# Patient Record
Sex: Female | Born: 1970
Health system: Southern US, Community
[De-identification: ages and names within clinical notes are randomized; demographics above are authoritative.]

## PROBLEM LIST (undated history)

## (undated) DIAGNOSIS — I350 Nonrheumatic aortic (valve) stenosis: Secondary | ICD-10-CM

## (undated) DIAGNOSIS — N840 Polyp of corpus uteri: Secondary | ICD-10-CM

## (undated) DIAGNOSIS — R011 Cardiac murmur, unspecified: Secondary | ICD-10-CM

## (undated) DIAGNOSIS — F419 Anxiety disorder, unspecified: Secondary | ICD-10-CM

## (undated) DIAGNOSIS — N898 Other specified noninflammatory disorders of vagina: Secondary | ICD-10-CM

## (undated) DIAGNOSIS — Q231 Congenital insufficiency of aortic valve: Secondary | ICD-10-CM

## (undated) HISTORY — DX: Cardiac murmur, unspecified: R01.1

## (undated) HISTORY — PX: CARDIAC VALVE REPLACEMENT: SHX585

## (undated) HISTORY — DX: Anxiety disorder, unspecified: F41.9

## (undated) HISTORY — PX: TRANSTHORACIC ECHOCARDIOGRAM: SHX275

---

## 1999-09-12 ENCOUNTER — Other Ambulatory Visit: Admission: RE | Admit: 1999-09-12 | Discharge: 1999-09-12 | Payer: Self-pay | Admitting: Gynecology

## 2000-10-03 ENCOUNTER — Other Ambulatory Visit: Admission: RE | Admit: 2000-10-03 | Discharge: 2000-10-03 | Payer: Self-pay | Admitting: Gynecology

## 2001-03-07 ENCOUNTER — Other Ambulatory Visit: Admission: RE | Admit: 2001-03-07 | Discharge: 2001-03-07 | Payer: Self-pay | Admitting: Gynecology

## 2001-09-30 ENCOUNTER — Other Ambulatory Visit: Admission: RE | Admit: 2001-09-30 | Discharge: 2001-09-30 | Payer: Self-pay | Admitting: Gynecology

## 2002-04-21 ENCOUNTER — Other Ambulatory Visit: Admission: RE | Admit: 2002-04-21 | Discharge: 2002-04-21 | Payer: Self-pay | Admitting: Gynecology

## 2002-11-19 ENCOUNTER — Other Ambulatory Visit: Admission: RE | Admit: 2002-11-19 | Discharge: 2002-11-19 | Payer: Self-pay | Admitting: Gynecology

## 2003-03-04 ENCOUNTER — Other Ambulatory Visit: Admission: RE | Admit: 2003-03-04 | Discharge: 2003-03-04 | Payer: Self-pay | Admitting: Gynecology

## 2003-11-26 ENCOUNTER — Other Ambulatory Visit: Admission: RE | Admit: 2003-11-26 | Discharge: 2003-11-26 | Payer: Self-pay | Admitting: Gynecology

## 2004-11-16 ENCOUNTER — Other Ambulatory Visit: Admission: RE | Admit: 2004-11-16 | Discharge: 2004-11-16 | Payer: Self-pay | Admitting: Gynecology

## 2006-01-30 ENCOUNTER — Other Ambulatory Visit: Admission: RE | Admit: 2006-01-30 | Discharge: 2006-01-30 | Payer: Self-pay | Admitting: Gynecology

## 2010-04-04 ENCOUNTER — Ambulatory Visit (INDEPENDENT_AMBULATORY_CARE_PROVIDER_SITE_OTHER): Payer: BC Managed Care – PPO | Admitting: Cardiology

## 2010-04-04 ENCOUNTER — Ambulatory Visit
Admission: RE | Admit: 2010-04-04 | Discharge: 2010-04-04 | Disposition: A | Discharge status: Home / Self Care | Payer: BC Managed Care – PPO | Source: Ambulatory Visit | Attending: Cardiology | Admitting: Cardiology

## 2010-04-04 ENCOUNTER — Other Ambulatory Visit: Payer: Self-pay | Admitting: Cardiology

## 2010-04-04 DIAGNOSIS — R0602 Shortness of breath: Secondary | ICD-10-CM

## 2010-04-04 DIAGNOSIS — R079 Chest pain, unspecified: Secondary | ICD-10-CM

## 2010-04-04 DIAGNOSIS — I359 Nonrheumatic aortic valve disorder, unspecified: Secondary | ICD-10-CM

## 2010-04-08 ENCOUNTER — Ambulatory Visit (HOSPITAL_COMMUNITY): Payer: BC Managed Care – PPO | Attending: Cardiology

## 2010-04-08 ENCOUNTER — Encounter: Payer: Self-pay | Admitting: Cardiology

## 2010-04-08 DIAGNOSIS — I359 Nonrheumatic aortic valve disorder, unspecified: Secondary | ICD-10-CM

## 2010-04-08 DIAGNOSIS — R072 Precordial pain: Secondary | ICD-10-CM

## 2010-04-08 DIAGNOSIS — R0989 Other specified symptoms and signs involving the circulatory and respiratory systems: Secondary | ICD-10-CM

## 2010-04-08 DIAGNOSIS — R0609 Other forms of dyspnea: Secondary | ICD-10-CM

## 2011-05-24 ENCOUNTER — Encounter: Payer: Self-pay | Admitting: *Deleted

## 2011-10-11 ENCOUNTER — Encounter (HOSPITAL_BASED_OUTPATIENT_CLINIC_OR_DEPARTMENT_OTHER): Payer: Self-pay | Admitting: *Deleted

## 2011-10-12 ENCOUNTER — Encounter (HOSPITAL_BASED_OUTPATIENT_CLINIC_OR_DEPARTMENT_OTHER): Payer: Self-pay | Admitting: *Deleted

## 2011-10-13 ENCOUNTER — Encounter (HOSPITAL_BASED_OUTPATIENT_CLINIC_OR_DEPARTMENT_OTHER): Payer: Self-pay | Admitting: *Deleted

## 2011-10-24 ENCOUNTER — Encounter: Payer: Self-pay | Admitting: Cardiology

## 2011-10-24 ENCOUNTER — Ambulatory Visit (INDEPENDENT_AMBULATORY_CARE_PROVIDER_SITE_OTHER): Payer: 59 | Admitting: Cardiology

## 2011-10-24 VITALS — BP 99/75 | HR 65 | Ht 66.0 in | Wt 162.8 lb

## 2011-10-24 DIAGNOSIS — I359 Nonrheumatic aortic valve disorder, unspecified: Secondary | ICD-10-CM

## 2011-10-24 DIAGNOSIS — I350 Nonrheumatic aortic (valve) stenosis: Secondary | ICD-10-CM | POA: Insufficient documentation

## 2011-10-24 NOTE — Progress Notes (Signed)
Shelby Eaton Date of Birth:  [birth year] [facility] [address] [place], [place]  [address] [phone]  Fax   [phone]  HPI: This pleasant 41 year old woman is seen for a followup office visit.  She was last seen in March 2012.  She has a history of known congenital aortic valve disease.  She has had a cardiac murmur since she was an infant.  We first saw her in 2003.  She had an echocardiogram 12/18/07 showing an aortic valve area of 0.7 she also has moderate to severe aortic insufficiency.  She has not been experiencing any cardiac symptoms.  She had a chest x-ray in March 2012 which showed normal heart size and no CHF.  She denies any chest pain or shortness of breath.  She exercises regularly.  She's had no palpitations.  She's had no exertional dizziness or syncope.  She plans to walk in the women's 5K race in the near future.  She is scheduled for a hysteroscopy and removal of some uterine polyps by Dr. Nicholas Lose on 11/07/2011  Current Outpatient Prescriptions  Medication Sig Dispense Refill  . aspirin 81 MG tablet Take 81 mg by mouth daily.        No Known Allergies  Patient Active Problem List  Diagnosis  . Aortic stenosis    History  Smoking status  . Former Smoker -- 20 years  . Types: Cigarettes  . Quit date: 08/16/2011  Smokeless tobacco  . Never Used  Comment: 1 PACK LAST FOR 2 WEEKS-- QUIT 2 MONTHS AGO    History  Alcohol Use  . Yes    weekends    No family history on file.  Review of Systems: The patient denies any heat or cold intolerance.  No weight gain or weight loss.  The patient denies headaches or blurry vision.  There is no cough or sputum production.  The patient denies dizziness.  There is no hematuria or hematochezia.  The patient denies any muscle aches or arthritis.  The patient denies any rash.  The patient denies frequent falling or instability.  There is no history of depression or anxiety.  All other systems were  reviewed and are negative.   Physical Exam: Filed Vitals:   10/24/11 0935  BP: 99/75  Pulse: 65   the general appearance reveals a healthy-appearing woman in no distress.Pupils equal and reactive.   Extraocular Movements are full.  There is no scleral icterus.  The mouth and pharynx are normal.  The neck is supple.  The carotids reveal normal upstroke and she does have bilateral bruits transmitted from the heart. The jugular venous pressure is normal.  The thyroid is not enlarged.  There is no lymphadenopathy. The chest is clear to percussion and auscultation. There are no rales or rhonchi. Expansion of the chest is symmetrical.  The heart reveals a grade 3/6 harsh systolic murmur of aortic stenosis the base which radiates into the neck and she has a grade 2/6 decrescendo murmur of aortic insufficiency of the left sternal edge.  There is no gallop or rub.  The abdomen is soft and nontender.  Extremities show no phlebitis or edema.  EKG shows sinus bradycardia and no ischemic changes.   Assessment / Plan: The patient is cleared for her upcoming gynecologic surgery with Dr. Nicholas Lose. We will plan to see her here in 6 months for a followup office visit and we will plan to get a two-dimensional echocardiogram prior to her next office visit.  The patient is not on any cardiac medications.  She takes an occasional baby aspirin.  She will leave off her aspirin for one week prior to GYN surgery.

## 2011-10-24 NOTE — Patient Instructions (Signed)
Your physician recommends that you continue on your current medications as directed. Please refer to the Current Medication list given to you today.  Your physician wants you to follow-up in: 6 months. You will receive a reminder letter in the mail two months in advance. If you don't receive a letter, please call our office to schedule the follow-up appointment.  

## 2011-10-24 NOTE — Assessment & Plan Note (Signed)
The patient remains asymptomatic from her aortic stenosis and aortic insufficiency.  Her electrocardiogram today shows sinus bradycardia at 59 per minute and possible left atrial enlargement and no ischemic changes.  She does have nonspecific T-wave flattening.  There is no evidence of LVH or strain

## 2011-11-02 ENCOUNTER — Encounter (HOSPITAL_BASED_OUTPATIENT_CLINIC_OR_DEPARTMENT_OTHER): Payer: Self-pay | Admitting: *Deleted

## 2011-11-03 ENCOUNTER — Encounter (HOSPITAL_BASED_OUTPATIENT_CLINIC_OR_DEPARTMENT_OTHER): Payer: Self-pay | Admitting: *Deleted

## 2011-11-03 NOTE — Progress Notes (Signed)
NPO AFTER MN. ARRIVES AT 0615. NEEDS HG AND URINE PREG. REVIEWED CHART W/ DR FORTUNE MDA W/ CARDIAC CLEARANCE, OK TO PROCEED.

## 2011-11-06 MED ORDER — SODIUM CHLORIDE 0.9 % IV SOLN
2.0000 g | Freq: Once | INTRAVENOUS | Status: AC
  Administered 2011-11-07: 2 g via INTRAVENOUS
  Filled 2011-11-06: qty 2000

## 2011-11-06 MED ORDER — GENTAMICIN SULFATE 40 MG/ML IJ SOLN
360.0000 mg | Freq: Once | INTRAVENOUS | Status: DC
  Filled 2011-11-06: qty 9

## 2011-11-06 MED ORDER — GENTAMICIN SULFATE 40 MG/ML IJ SOLN: 5.0000 mg/kg | Freq: Once | INTRAVENOUS | Status: DC

## 2011-11-06 NOTE — Anesthesia Preprocedure Evaluation (Addendum)
Anesthesia Evaluation  Patient identified by MRN, date of birth, ID band Patient awake    Reviewed: Allergy & Precautions, H&P , NPO status , Patient's Chart, lab work & pertinent test results  Airway Mallampati: II TM Distance: >3 FB Neck ROM: Full    Dental  (+) Teeth Intact and Dental Advisory Given   Pulmonary neg pulmonary ROS,  breath sounds clear to auscultation  Pulmonary exam normal       Cardiovascular negative cardio ROS  Valvular problems/murmurs: Severe aortic valve stenosis/aortic insufficiency per history. AS and AI Rhythm:Regular Rate:Normal + Systolic murmurs (Gd. 3-4/6) 2D ECHO 2009: severe AS with moderately severe AI; no pulmonary hypertension; asymptomatic   Neuro/Psych negative neurological ROS  negative psych ROS   GI/Hepatic negative GI ROS, Neg liver ROS,   Endo/Other  negative endocrine ROS  Renal/GU negative Renal ROS  negative genitourinary   Musculoskeletal negative musculoskeletal ROS (+)   Abdominal   Peds  Hematology negative hematology ROS (+)   Anesthesia Other Findings   Reproductive/Obstetrics negative OB ROS                         Anesthesia Physical Anesthesia Plan  ASA: III  Anesthesia Plan: MAC   Post-op Pain Management:    Induction: Intravenous  Airway Management Planned: Simple Face Mask  Additional Equipment:   Intra-op Plan:   Post-operative Plan:   Informed Consent: I have reviewed the patients History and Physical, chart, labs and discussed the procedure including the risks, benefits and alternatives for the proposed anesthesia with the patient or authorized representative who has indicated his/her understanding and acceptance.   Dental advisory given  Plan Discussed with: CRNA  Anesthesia Plan Comments:        Anesthesia Quick Evaluation

## 2011-11-07 ENCOUNTER — Ambulatory Visit (HOSPITAL_BASED_OUTPATIENT_CLINIC_OR_DEPARTMENT_OTHER)
Admission: RE | Admit: 2011-11-07 | Discharge: 2011-11-07 | Disposition: A | Discharge status: Home / Self Care | Payer: 59 | Source: Ambulatory Visit | Attending: Gynecology | Admitting: Gynecology

## 2011-11-07 ENCOUNTER — Encounter (HOSPITAL_BASED_OUTPATIENT_CLINIC_OR_DEPARTMENT_OTHER)
Admission: RE | Disposition: A | Discharge status: Home / Self Care | Payer: Self-pay | Source: Ambulatory Visit | Attending: Gynecology

## 2011-11-07 ENCOUNTER — Encounter (HOSPITAL_BASED_OUTPATIENT_CLINIC_OR_DEPARTMENT_OTHER): Payer: Self-pay | Admitting: Anesthesiology

## 2011-11-07 ENCOUNTER — Ambulatory Visit (HOSPITAL_BASED_OUTPATIENT_CLINIC_OR_DEPARTMENT_OTHER): Payer: 59 | Admitting: Anesthesiology

## 2011-11-07 ENCOUNTER — Encounter (HOSPITAL_BASED_OUTPATIENT_CLINIC_OR_DEPARTMENT_OTHER): Payer: Self-pay | Admitting: *Deleted

## 2011-11-07 DIAGNOSIS — N840 Polyp of corpus uteri: Secondary | ICD-10-CM | POA: Insufficient documentation

## 2011-11-07 DIAGNOSIS — N926 Irregular menstruation, unspecified: Secondary | ICD-10-CM | POA: Insufficient documentation

## 2011-11-07 DIAGNOSIS — D281 Benign neoplasm of vagina: Secondary | ICD-10-CM | POA: Insufficient documentation

## 2011-11-07 DIAGNOSIS — N939 Abnormal uterine and vaginal bleeding, unspecified: Secondary | ICD-10-CM | POA: Insufficient documentation

## 2011-11-07 DIAGNOSIS — R011 Cardiac murmur, unspecified: Secondary | ICD-10-CM | POA: Insufficient documentation

## 2011-11-07 HISTORY — DX: Nonrheumatic aortic (valve) stenosis: I35.0

## 2011-11-07 HISTORY — DX: Polyp of corpus uteri: N84.0

## 2011-11-07 HISTORY — DX: Congenital insufficiency of aortic valve: Q23.1

## 2011-11-07 HISTORY — DX: Other specified noninflammatory disorders of vagina: N89.8

## 2011-11-07 LAB — POCT PREGNANCY, URINE: Preg Test, Ur: NEGATIVE

## 2011-11-07 LAB — POCT HEMOGLOBIN-HEMACUE: Hemoglobin: 11.9 g/dL — ABNORMAL LOW (ref 12.0–15.0)

## 2011-11-07 SURGERY — DILATATION & CURETTAGE/HYSTEROSCOPY WITH RESECTOCOPE
Anesthesia: Monitor Anesthesia Care | Site: Vagina | Laterality: Right | Wound class: Clean Contaminated

## 2011-11-07 MED ORDER — LACTATED RINGERS IV SOLN: INTRAVENOUS | Status: DC

## 2011-11-07 MED ORDER — PROMETHAZINE HCL 25 MG/ML IJ SOLN: 6.2500 mg | INTRAMUSCULAR | Status: DC | PRN

## 2011-11-07 MED ORDER — FENTANYL CITRATE 0.05 MG/ML IJ SOLN
INTRAMUSCULAR | Status: DC | PRN
  Administered 2011-11-07 (×3): 12.5 ug via INTRAVENOUS
  Administered 2011-11-07: 50 ug via INTRAVENOUS
  Administered 2011-11-07: 12.5 ug via INTRAVENOUS

## 2011-11-07 MED ORDER — PROPOFOL 10 MG/ML IV BOLUS
INTRAVENOUS | Status: DC | PRN
  Administered 2011-11-07: 30 mg via INTRAVENOUS

## 2011-11-07 MED ORDER — LIDOCAINE HCL (CARDIAC) 20 MG/ML IV SOLN
INTRAVENOUS | Status: DC | PRN
  Administered 2011-11-07: 50 mg via INTRAVENOUS

## 2011-11-07 MED ORDER — PROPOFOL 10 MG/ML IV EMUL
INTRAVENOUS | Status: DC | PRN
  Administered 2011-11-07: 100 ug/kg/min via INTRAVENOUS

## 2011-11-07 MED ORDER — GLYCINE 1.5 % IR SOLN
Status: DC | PRN
  Administered 2011-11-07: 6000 mL

## 2011-11-07 MED ORDER — LIDOCAINE HCL (PF) 1 % IJ SOLN
INTRAMUSCULAR | Status: DC | PRN
  Administered 2011-11-07: 10 mL

## 2011-11-07 MED ORDER — ONDANSETRON HCL 4 MG/2ML IJ SOLN
INTRAMUSCULAR | Status: DC | PRN
  Administered 2011-11-07 (×2): 2 mg via INTRAVENOUS

## 2011-11-07 MED ORDER — LACTATED RINGERS IV SOLN
INTRAVENOUS | Status: DC
  Administered 2011-11-07: 07:00:00 via INTRAVENOUS

## 2011-11-07 MED ORDER — LACTATED RINGERS IV SOLN
INTRAVENOUS | Status: DC
  Administered 2011-11-07: 10:00:00 via INTRAVENOUS

## 2011-11-07 MED ORDER — OXYCODONE-ACETAMINOPHEN 5-325 MG PO TABS
1.0000 | ORAL_TABLET | ORAL | Status: DC | PRN
  Administered 2011-11-07: 1 via ORAL

## 2011-11-07 MED ORDER — MORPHINE SULFATE 2 MG/ML IJ SOLN: 1.0000 mg | INTRAMUSCULAR | Status: DC | PRN

## 2011-11-07 MED ORDER — DEXAMETHASONE SODIUM PHOSPHATE 4 MG/ML IJ SOLN
INTRAMUSCULAR | Status: DC | PRN
  Administered 2011-11-07 (×2): 4 mg via INTRAVENOUS

## 2011-11-07 MED ORDER — GENTAMICIN SULFATE 40 MG/ML IJ SOLN
120.0000 mg | Freq: Once | INTRAVENOUS | Status: AC
  Administered 2011-11-07: 120 mg via INTRAVENOUS

## 2011-11-07 MED ORDER — MIDAZOLAM HCL 5 MG/5ML IJ SOLN
INTRAMUSCULAR | Status: DC | PRN
  Administered 2011-11-07: 2 mg via INTRAVENOUS

## 2011-11-07 SURGICAL SUPPLY — 58 items
BLADE SURG 15 STRL LF DISP TIS (BLADE) ×2 IMPLANT
BLADE SURG 15 STRL SS (BLADE) ×1
CANISTER SUCTION 1200CC (MISCELLANEOUS) IMPLANT
CANISTER SUCTION 2500CC (MISCELLANEOUS) ×6 IMPLANT
CATH ROBINSON RED A/P 16FR (CATHETERS) ×3 IMPLANT
CLOTH BEACON ORANGE TIMEOUT ST (SAFETY) ×3 IMPLANT
CORD ACTIVE DISPOSABLE (ELECTRODE) ×1
CORD ELECTRO ACTIVE DISP (ELECTRODE) ×2 IMPLANT
COVER TABLE BACK 60X90 (DRAPES) ×3 IMPLANT
DRAPE CAMERA CLOSED 9X96 (DRAPES) ×3 IMPLANT
DRAPE LG THREE QUARTER DISP (DRAPES) ×3 IMPLANT
DRAPE UNDERBUTTOCKS STRL (DRAPE) ×3 IMPLANT
ELECT LOOP GYNE PRO 24FR (CUTTING LOOP) ×3
ELECT NEEDLE TIP 2.8 STRL (NEEDLE) IMPLANT
ELECT REM PT RETURN 9FT ADLT (ELECTROSURGICAL) ×3
ELECT VAPORTRODE GRVD BAR (ELECTRODE) IMPLANT
ELECTRODE LOOP GYNE PRO 24FR (CUTTING LOOP) ×2 IMPLANT
ELECTRODE REM PT RTRN 9FT ADLT (ELECTROSURGICAL) ×2 IMPLANT
ETHILON ×3 IMPLANT
GLOVE BIO SURGEON STRL SZ8 (GLOVE) ×6 IMPLANT
GLOVE BIOGEL PI IND STRL 7.0 (GLOVE) ×2 IMPLANT
GLOVE BIOGEL PI INDICATOR 7.0 (GLOVE) ×1
GLOVE ECLIPSE 7.0 STRL STRAW (GLOVE) ×3 IMPLANT
GLYCINE 1.5% IRRIG UROMATIC (IV SOLUTION) ×6 IMPLANT
GOWN PREVENTION PLUS LG XLONG (DISPOSABLE) ×6 IMPLANT
GOWN STRL NON-REIN LRG LVL3 (GOWN DISPOSABLE) ×3 IMPLANT
GOWN STRL REIN XL XLG (GOWN DISPOSABLE) ×3 IMPLANT
LEGGING LITHOTOMY PAIR STRL (DRAPES) ×3 IMPLANT
NEEDLE HYPO 25X1 1.5 SAFETY (NEEDLE) IMPLANT
NEEDLE HYPO 25X5/8 SAFETYGLIDE (NEEDLE) IMPLANT
NEEDLE SPNL 22GX3.5 QUINCKE BK (NEEDLE) ×3 IMPLANT
NS IRRIG 500ML POUR BTL (IV SOLUTION) IMPLANT
PACK BASIN DAY SURGERY FS (CUSTOM PROCEDURE TRAY) ×3 IMPLANT
PAD OB MATERNITY 4.3X12.25 (PERSONAL CARE ITEMS) ×3 IMPLANT
PAD PREP 24X48 CUFFED NSTRL (MISCELLANEOUS) ×3 IMPLANT
PENCIL BUTTON HOLSTER BLD 10FT (ELECTRODE) ×3 IMPLANT
STRIP CLOSURE SKIN 1/4X4 (GAUZE/BANDAGES/DRESSINGS) IMPLANT
SUT MON AB 5-0 P3 18 (SUTURE) IMPLANT
SUT VIC AB 2-0 SH 27 (SUTURE) ×1
SUT VIC AB 2-0 SH 27XBRD (SUTURE) ×2 IMPLANT
SUT VIC AB 3-0 PS2 18 (SUTURE)
SUT VIC AB 3-0 PS2 18XBRD (SUTURE) IMPLANT
SUT VIC AB 3-0 SH 27 (SUTURE)
SUT VIC AB 3-0 SH 27X BRD (SUTURE) IMPLANT
SUT VIC AB 4-0 SH 27 (SUTURE)
SUT VIC AB 4-0 SH 27XANBCTRL (SUTURE) IMPLANT
SUT VIC AB 5-0 PS2 18 (SUTURE) ×3 IMPLANT
SUT VICRYL 6 0 UNDY PS 6 (SUTURE) IMPLANT
SWAB CULTURE LIQ STUART DBL (MISCELLANEOUS) IMPLANT
SYR BULB IRRIGATION 50ML (SYRINGE) IMPLANT
SYR CONTROL 10ML LL (SYRINGE) ×3 IMPLANT
TOWEL OR 17X24 6PK STRL BLUE (TOWEL DISPOSABLE) ×6 IMPLANT
TRAY DSU PREP LF (CUSTOM PROCEDURE TRAY) ×3 IMPLANT
TUBE ANAEROBIC SPECIMEN COL (MISCELLANEOUS) IMPLANT
TUBE CONNECTING 12X1/4 (SUCTIONS) IMPLANT
TUBING HYDROFLEX HYSTEROSCOPY (TUBING) ×3 IMPLANT
WATER STERILE IRR 500ML POUR (IV SOLUTION) ×3 IMPLANT
YANKAUER SUCT BULB TIP NO VENT (SUCTIONS) IMPLANT

## 2011-11-07 NOTE — Transfer of Care (Signed)
Immediate Anesthesia Transfer of Care Note  Patient: Shelby Eaton  Procedure(s) Performed: Procedure(s) (LRB): DILATATION & CURETTAGE/HYSTEROSCOPY WITH RESECTOCOPE (N/A) EXCISION VAGINAL CYST (Right)  Patient Location: PACU  Anesthesia Type: MAC  Level of Consciousness: awake, alert  and oriented  Airway & Oxygen Therapy: Patient Spontanous Breathing and Patient connected to face mask oxygen  Post-op Assessment: Report given to PACU RN and Post -op Vital signs reviewed and stable  Post vital signs: Reviewed and stable  Complications: No apparent anesthesia complications

## 2011-11-07 NOTE — Anesthesia Postprocedure Evaluation (Signed)
Anesthesia Post Note  Patient: Shelby Eaton  Procedure(s) Performed: Procedure(s) (LRB): DILATATION & CURETTAGE/HYSTEROSCOPY WITH RESECTOCOPE (N/A) EXCISION VAGINAL CYST (Right)  Anesthesia type: MAC  Patient location: PACU  Post pain: Pain level controlled  Post assessment: Post-op Vital signs reviewed  Last Vitals:  Filed Vitals:   11/07/11 0930  BP: 108/64  Pulse: 45  Temp:   Resp: 18    Post vital signs: Reviewed  Level of consciousness: sedated  Complications: No apparent anesthesia complications

## 2011-11-07 NOTE — Anesthesia Procedure Notes (Signed)
Procedure Name: MAC Date/Time: 11/07/2011 7:40 AM Performed by: Norva Pavlov Pre-anesthesia Checklist: Patient identified, Emergency Drugs available, Suction available, Patient being monitored and Timeout performed Patient Re-evaluated:Patient Re-evaluated prior to inductionOxygen Delivery Method: Simple face mask Intubation Type: IV induction Ventilation: Oral airway inserted - appropriate to patient size Dental Injury: Teeth and Oropharynx as per pre-operative assessment

## 2011-11-08 NOTE — Op Note (Signed)
Shelby Eaton, SCHUELKE NO.:  [id number]  MEDICAL RECORD NO.:  [id number]  LOCATION:                               FACILITY:  [facility]  PHYSICIAN:  Gretta Cool, M.D.      DATE OF BIRTH:  DATE OF PROCEDURE:  11/07/2011 DATE OF DISCHARGE:  11/07/2011                              OPERATIVE REPORT   PREOPERATIVE DIAGNOSES: 1. Endometrial polyps demonstrated by saline ultrasound with abnormal     uterine bleeding on hormonal contraception. 2. Prolapsing wolffian remnant cyst from the right lateral vaginal     wall prolapsing through the introitus with coughing or straining.  PROCEDURE:  Hysteroscopy with endometrial sampling and excision of wolffian duct remnant from the right lateral vaginal wall, and revision of right lateral vaginal mucosa.  SURGEON:  Gretta Cool, MD  ANESTHESIA:  IV sedation and paracervical block and local infiltration for the vaginal wall cyst.  DESCRIPTION OF PROCEDURE:  Under excellent anesthesia as above with the patient prepped and draped in Allen stirrups, and her bladder drained. A weighted speculum was placed in the vagina and the cervix grasped with a single-tooth tenaculum pulled down into view.  The cervix was then progressively dilated with a series of Pratt dilators to accommodate the 7-mm resectoscope.  The resectoscope was then introduced and the cavity examined.  Interestingly, the polyps observed previously by saline ultrasound and documented well, had resolved completely, even though they were centimeter in size and two of them.  At this point, the endometrial cavity was sampled with resectoscope loop, but no resection was performed.  At this point, attention was turned to the right lateral vaginal wall cyst.  The cyst was easily prolapsed through the vaginal introitus for a distance of 2-3 cm.  It was grasped with Allis clamps and then infiltrated with Xylocaine 1% plain.  The lesion was then incised vertically and  the cyst wall dissected from the vaginal mucosa. This was well deflated as the dissection went along.  The entire cyst wall was dissected free from the vaginal mucosa as far up as I could reach.  It became a more fibrous and very firm structure at its base. At the base of the procedure when I could no longer pursue it, the residue was clamped across with hemostat clamp and transected.  It was sutured with 2-0 Vicryl.  At this point, all bleeding was well controlled.  The mucosa was trimmed and the right lateral vaginal wall was closed with a running subcuticular closure of 2-0 Vicryl.  At the end of the procedure, there was no significant bleeding.  The patient was returned to the recovery room in excellent condition without complication.         ______________________________ Gretta Cool, M.D.    CWL/MEDQ  D:  11/07/2011  T:  11/07/2011  Job:  [id number]

## 2012-01-08 ENCOUNTER — Ambulatory Visit (INDEPENDENT_AMBULATORY_CARE_PROVIDER_SITE_OTHER): Payer: 59 | Admitting: Physician Assistant

## 2012-01-08 VITALS — BP 142/90 | HR 56 | Ht 66.0 in | Wt 165.8 lb

## 2012-01-08 DIAGNOSIS — I359 Nonrheumatic aortic valve disorder, unspecified: Secondary | ICD-10-CM

## 2012-01-08 DIAGNOSIS — R079 Chest pain, unspecified: Secondary | ICD-10-CM

## 2012-01-08 DIAGNOSIS — R109 Unspecified abdominal pain: Secondary | ICD-10-CM

## 2012-01-08 LAB — BASIC METABOLIC PANEL
Chloride: 103 mEq/L (ref 96–112)
Creatinine, Ser: 0.8 mg/dL (ref 0.4–1.2)
GFR: 79.63 mL/min (ref >60.00)

## 2012-01-08 LAB — CBC WITH DIFFERENTIAL/PLATELET
Basophils Relative: 0.7 % (ref 0.0–3.0)
Eosinophils Relative: 0.9 % (ref 0.0–5.0)
Lymphocytes Relative: 33.2 % (ref 12.0–46.0)
MCV: 84.4 fl (ref 78.0–100.0)
Monocytes Absolute: 0.5 10*3/uL (ref 0.1–1.0)
Monocytes Relative: 5.1 % (ref 3.0–12.0)
Neutrophils Relative %: 60.1 % (ref 43.0–77.0)
RBC: 4.48 Mil/uL (ref 3.87–5.11)
WBC: 9.6 10*3/uL (ref 4.5–10.5)

## 2012-01-08 LAB — HEPATIC FUNCTION PANEL
ALT: 16 U/L (ref 0–35)
AST: 18 U/L (ref 0–37)
Alkaline Phosphatase: 49 U/L (ref 39–117)
Bilirubin, Direct: 0.1 mg/dL (ref 0.0–0.3)
Total Protein: 7.5 g/dL (ref 6.0–8.3)

## 2012-01-08 MED ORDER — OMEPRAZOLE 40 MG PO CPDR: 40.0000 mg | DELAYED_RELEASE_CAPSULE | Freq: Every day | ORAL | Status: DC

## 2012-01-08 NOTE — Progress Notes (Signed)
[address], [address] [place], [place]  [address] Phone: [phone], Fax:  [phone]  Date:  01/08/2012   Name:  Shelby Eaton   DOB:  1970-02-09   MRN:  [id number]  PCP:  No primary provider on file.  Primary Cardiologist:  Dr. Cassell Clement  Primary Electrophysiologist:  None    History of Present Illness: Shelby Eaton is a 41 y.o. female who returns for evaluation of chest pain.  She has a hx of congenital aortic valve disease with aortic stenosis and aortic insufficiency. Echo 11/09:  EF 55-60%, severe AS, mean gradient 51 mmHg, AVA 0.7, mod to severe AI (no change from 5/08).  Last seen by Dr. Patty Sermons 10/13.  Over the last 1-2 mos, she has developed chest tightness. This is brought on by meals.  No assoc dyspnea, arm or jaw pain, diaphoresis or nausea.  She has had no relief with antacids.  She is concerned it is from her gallbladder.  No syncope.  No exertional chest pain.  No exertional dyspnea.  No orthopnea, PND, edema.   Wt Readings from Last 3 Encounters:  01/08/12 165 lb 12.8 oz (75.206 kg)  11/07/11 163 lb 5 oz (74.078 kg)  11/07/11 163 lb 5 oz (74.078 kg)     Past Medical History  Diagnosis Date  . Murmur   . Severe aortic valve stenosis CONGENITAL --  CARDIOLOGIST -- DR BRACKBILL-- LOV IN EPIC   . Aortic valve insufficiency, congenital   . Endometrial polyp   . Vaginal cyst     Current Outpatient Prescriptions  Medication Sig Dispense Refill  . aspirin 81 MG tablet Take 81 mg by mouth daily.        Allergies:  No Known Allergies  Social History:  The patient  reports that she quit smoking about 4 months ago. Her smoking use included Cigarettes. She quit after 20 years of use. She has never used smokeless tobacco. She reports that she drinks alcohol. She reports that she does not use illicit drugs.   ROS:  Please see the history of present illness.   All other systems reviewed and negative.   PHYSICAL EXAM: VS:  BP 142/90   Pulse 56  Ht 5\' 6"  (1.676 m)  Wt 165 lb 12.8 oz (75.206 kg)  BMI 26.76 kg/m2 Well nourished, well developed, in no acute distress HEENT: normal Neck: no JVD Cardiac:  normal S1, diminished S2; RRR; harsh 3/6 systolic murmur at the RUSB, 2/6 diastolic murmur along the LSB Lungs:  clear to auscultation bilaterally, no wheezing, rhonchi or rales Abd: soft, nontender, no hepatomegaly Ext: no edema Skin: warm and dry Neuro:  CNs 2-12 intact, no focal abnormalities noted  EKG:  Sinus bradycardia, HR 56, nonspecific ST-T wave changes      ASSESSMENT AND PLAN:  1. Chest Pain:   Overall, seems to have more GI symptoms than cardiac.  She denies exertional chest pain, syncope, orthopnea, PND or edema.  Symptoms would usually subside 1-2 hours after eating.  They are now more persistent.  Will arrange abdominal US, CBC, LFTs, BMET.  Also, obtain 2D echo.  If AS remains severe or is worse, she may need further evaluation of her valve.  Will start her on Omeprazole 40 mg QD.  2. Aortic Valve Disease:   Obtain Echo.  If severe or critical, she may need cardiac cath.  3. Disposition:   Follow up with Dr. Cassell Clement in 2 weeks.   Signed, Tereso Newcomer,  PA-C  10:55 AM 01/08/2012

## 2012-01-08 NOTE — Patient Instructions (Addendum)
START OMEPRAZOLE 40 MG DAILY, TAKE 30 MINUTES BEFORE BREAKFAST  LAB TODAY; BMET, CBC W/DIFF, LFT  Your physician has requested that you have an echocardiogram. Echocardiography is a painless test that uses sound waves to create images of your heart. It provides your doctor with information about the size and shape of your heart and how well your heart's chambers and valves are working. This procedure takes approximately one hour. There are no restrictions for this procedure.  Your physician recommends that you schedule a follow-up appointment in: 2 WEEKS WITH DR. Patty Sermons  PLEASE SCHEDULE AN COMPLETE ABDOMINAL U/S DX ABDOMINAL PAIN, R/O GALLBLADDER DZ

## 2012-01-09 ENCOUNTER — Telehealth: Payer: Self-pay | Admitting: *Deleted

## 2012-01-09 ENCOUNTER — Ambulatory Visit (HOSPITAL_COMMUNITY)
Admission: RE | Admit: 2012-01-09 | Discharge: 2012-01-09 | Disposition: A | Discharge status: Home / Self Care | Payer: 59 | Source: Ambulatory Visit | Attending: Physician Assistant | Admitting: Physician Assistant

## 2012-01-09 DIAGNOSIS — R109 Unspecified abdominal pain: Secondary | ICD-10-CM | POA: Insufficient documentation

## 2012-01-09 NOTE — Telephone Encounter (Signed)
Message copied by Tarri Fuller on Tue Jan 09, 2012  2:02 PM ------      Message from: [place], [place] T      Created: Tue Jan 09, 2012  2:00 PM       No gallstones.      Continue with current treatment plan.      Tereso Newcomer, PA-C  2:00 PM 01/09/2012

## 2012-01-09 NOTE — Telephone Encounter (Signed)
lmom no gallstone on abd. u/s

## 2012-01-09 NOTE — Telephone Encounter (Signed)
Message copied by Tarri Fuller on Tue Jan 09, 2012 11:25 AM ------      Message from: [place], [place] T      Created: Tue Jan 09, 2012  8:12 AM       Labs normal      Continue with current treatment plan.      Tereso Newcomer, PA-C  8:12 AM 01/09/2012

## 2012-01-09 NOTE — Telephone Encounter (Signed)
lmom normal labs w/no changes to be made

## 2012-01-10 ENCOUNTER — Ambulatory Visit (HOSPITAL_COMMUNITY): Payer: 59 | Attending: Cardiology

## 2012-01-10 DIAGNOSIS — I359 Nonrheumatic aortic valve disorder, unspecified: Secondary | ICD-10-CM

## 2012-01-10 DIAGNOSIS — I350 Nonrheumatic aortic (valve) stenosis: Secondary | ICD-10-CM

## 2012-01-10 NOTE — Progress Notes (Signed)
Echocardiogram performed.  

## 2012-01-15 ENCOUNTER — Telehealth: Payer: Self-pay | Admitting: Cardiology

## 2012-01-15 NOTE — Telephone Encounter (Signed)
Advised patient

## 2012-01-15 NOTE — Telephone Encounter (Signed)
Pt rtn call re echo results, pls call

## 2012-01-15 NOTE — Telephone Encounter (Signed)
Message copied by Burnell Blanks on Mon Jan 15, 2012  6:02 PM ------      Message from: Cassell Clement      Created: Wed Jan 10, 2012  9:29 PM       Please report. Echo shows aortic stenosis has become more severe.  Discuss further at OV.

## 2012-01-23 ENCOUNTER — Encounter: Payer: Self-pay | Admitting: Cardiology

## 2012-01-23 ENCOUNTER — Ambulatory Visit (INDEPENDENT_AMBULATORY_CARE_PROVIDER_SITE_OTHER): Payer: 59 | Admitting: Cardiology

## 2012-01-23 VITALS — BP 130/70 | HR 74 | Resp 18 | Ht 66.0 in | Wt 167.0 lb

## 2012-01-23 DIAGNOSIS — I359 Nonrheumatic aortic valve disorder, unspecified: Secondary | ICD-10-CM

## 2012-01-23 DIAGNOSIS — I350 Nonrheumatic aortic (valve) stenosis: Secondary | ICD-10-CM

## 2012-01-23 NOTE — Patient Instructions (Addendum)
Your physician recommends that you continue on your current medications as directed. Please refer to the Current Medication list given to you today.  Your physician recommends that you schedule a follow-up appointment in: April 17, 2012 @ 1:30

## 2012-01-23 NOTE — Assessment & Plan Note (Signed)
The patient has not become symptomatic from her mixed aortic stenosis and insufficiency.  She is not showing any symptoms of congestive heart failure.  She was experiencing precordial chest fullness.  She came to the office today with her mother and we discussed next steps.  I have recommended that we make preparations for her to have a consultation with a cardiac surgeon and also we will make preparations for cardiac catheterization to rule out coexistent coronary artery disease as well as to assess her aortic valve.  Her last chest x-ray in March 2012 showed normal heart size and normal mediastinal contours.  We will be back in contact with the patient early next week to discuss timing of surgical consultation and cardiac catheterization further.

## 2012-01-23 NOTE — Progress Notes (Signed)
Shelby Eaton Date of Birth:  1970-02-11 [facility] [address] [place], [place]  [address] [phone]  Fax   [phone]  HPI: This pleasant 41 year old woman is seen back for a scheduled followup office visit.  She has a past history of known aortic stenosis and has had a murmur since infancy.  Echocardiograms have shown a bicuspid aortic valve with aortic stenosis and aortic insufficiency.  Up until recently she has had no cardiac symptoms.  She had an echocardiogram on 12/18/07 which showed a peak aortic valve gradient of 93 and a mean gradient of 51.  She was asymptomatic until gout 2 weeks prior to Thanksgiving when she began to experience some epigastric and substernal fullness and tightness.  She saw Tereso Newcomer in the office for these complaints.  An ultrasound of the abdomen was performed which was normal.  She also had an update of her echocardiogram which was done on 01/10/12 and showed a significant worsening of her aortic stenosis.  Her peak aortic valve gradient is 132 and her mean is 76.  Her EKG done at the time of that office visit showed some mild nonspecific ST-T wave changes.  Since her echocardiogram the patient has been avoiding strenuous activity.  She is still having some mild intermittent chest tightness but is in no acute distress.  She has continued to work full-time in [employer].    Current Outpatient Prescriptions  Medication Sig Dispense Refill  . aspirin 81 MG tablet Take 81 mg by mouth daily.      Marland Kitchen omeprazole (PRILOSEC) 40 MG capsule Take 1 capsule (40 mg total) by mouth daily. TAKE 30 MINUTES BEFORE BREAKFAST  30 capsule  6    No Known Allergies  Patient Active Problem List  Diagnosis  . Aortic stenosis    History  Smoking status  . Former Smoker -- 20 years  . Types: Cigarettes  . Quit date: 08/16/2011  Smokeless tobacco  . Never Used    Comment: 1 PACK LAST FOR 2 WEEKS-- QUIT 2 MONTHS AGO (JULY  2013)    History  Alcohol Use  . Yes    Comment: weekends    No family history on file.  Review of Systems: The patient denies any heat or cold intolerance.  No weight gain or weight loss.  The patient denies headaches or blurry vision.  There is no cough or sputum production.  The patient denies dizziness.  There is no hematuria or hematochezia.  The patient denies any muscle aches or arthritis.  The patient denies any rash.  The patient denies frequent falling or instability.  There is no history of depression or anxiety.  All other systems were reviewed and are negative.   Physical Exam: Filed Vitals:   01/23/12 1345  BP: 130/70  Pulse: 74  Resp: 18   the general appearance reveals a healthy-appearing young woman in no distress.  The head and neck exam reveals a normal carotid upstroke and no jugular venous distention.  The lungs are clear.  The heart reveals a grade 3/6 harsh systolic ejection murmur radiating to the neck.  There is a soft aortic insufficiency murmur grade 2/6 along the left sternal edge.  There is no gallop or rub.  The abdomen is soft and nontender without hepatosplenomegaly or masses.  Extremities show no phlebitis or edema.  The neurologic exam is normal.       Assessment / Plan: Proceed with discussion with plans for cardiac catheterization  and surgical consultation in the near future.  We will touch base with her at the beginning of next week to finalize details.  Continue moderate but not strenuous exertion.

## 2012-01-25 ENCOUNTER — Telehealth: Payer: Self-pay | Admitting: Cardiology

## 2012-01-25 NOTE — Telephone Encounter (Signed)
See the note I sent to [facility] today regarding her cath.

## 2012-01-25 NOTE — Telephone Encounter (Signed)
Spoke with pt, she would like dr cooper or  mcalhany to do her cath, hopefully radial approach. Then she would like to go to [facility] to dr glower for the surgery. Will make dr Patty Sermons aware.

## 2012-01-25 NOTE — Telephone Encounter (Signed)
Spoke with pt, scheduled to see dr Excell Seltzer 02/01/12.

## 2012-01-25 NOTE — Telephone Encounter (Signed)
That will be okay.  Please set her up to see either Dr. Excell Seltzer or Dr. Clifton James for a precath visit (whoever can see her first) and then go from there.  She also needs a chest xray pre-cath (none since 2012).

## 2012-01-25 NOTE — Telephone Encounter (Signed)
Pt to have Valve replacement surgery , does not want done at [facility], cath ok to do, but wants surgery done at [facility], pls call

## 2012-01-29 ENCOUNTER — Other Ambulatory Visit (HOSPITAL_COMMUNITY): Payer: 59

## 2012-02-01 ENCOUNTER — Ambulatory Visit (INDEPENDENT_AMBULATORY_CARE_PROVIDER_SITE_OTHER): Payer: 59 | Admitting: Cardiovascular Disease

## 2012-02-01 ENCOUNTER — Encounter: Payer: Self-pay | Admitting: Cardiovascular Disease

## 2012-02-01 VITALS — BP 122/79 | HR 64 | Ht 65.0 in | Wt 165.8 lb

## 2012-02-01 DIAGNOSIS — I359 Nonrheumatic aortic valve disorder, unspecified: Secondary | ICD-10-CM

## 2012-02-01 DIAGNOSIS — I350 Nonrheumatic aortic (valve) stenosis: Secondary | ICD-10-CM

## 2012-02-01 NOTE — Patient Instructions (Addendum)
You have been referred to Dr. Silvestre Mesi at [facility] for Aortic Valve Replacement Surgery.

## 2012-02-01 NOTE — Progress Notes (Signed)
HPI:  42 year-old woman presenting for evaluation of severe aortic stenosis. She is followed by Dr Patty Sermons. The patient has congenital aortic valve disease with a bicuspid valve. Over the last several months she has developed exertional chest tightness and shortness of breath with moderate physical exertion. She has not had resting symptoms, lightheadedness, or syncope. A recent echo showed normal LV function with an LVEF of 60-65%, and severe AS with a peak gradient of 132 mm Hg and a mean gradient of 76 mm Hg. The mitral valve was normal.   She otherwise feels well and has no complaints today. She presents for discussion of cardiac catheterization. She also has requested referral to [facility] as she desires a minimally-invasive approach to her aortic valve surgery and has a good friend who recently had AVR with Dr Silvestre Mesi.  Outpatient Encounter Prescriptions as of 02/01/2012  Medication Sig Dispense Refill  . aspirin 81 MG tablet Take 81 mg by mouth daily.      . [DISCONTINUED] omeprazole (PRILOSEC) 40 MG capsule Take 1 capsule (40 mg total) by mouth daily. TAKE 30 MINUTES BEFORE BREAKFAST  30 capsule  6    No Known Allergies  Past Medical History  Diagnosis Date  . Murmur   . Severe aortic valve stenosis CONGENITAL --  CARDIOLOGIST -- DR BRACKBILL-- LOV IN EPIC   . Aortic valve insufficiency, congenital   . Endometrial polyp   . Vaginal cyst     ROS: Negative except as per HPI  BP 122/79  Pulse 64  Ht 5\' 5"  (1.651 m)  Wt 75.184 kg (165 lb 12 oz)  BMI 27.58 kg/m2  PHYSICAL EXAM: Pt is alert and oriented, healthy-appearing young woman in NAD HEENT: normal Neck: JVP - normal, carotids delayed with bilateral bruits Lungs: CTA bilaterally CV: RRR with a grade 3/6 harsh crescendo decrescendo murmur at the RUSB. There is no diastolic murmur. Abd: soft, NT, Positive BS, no hepatomegaly Ext: no C/C/E, distal pulses intact and equal Skin: warm/dry no rash  2D Echo: Left  ventricle: The cavity size was normal. Wall thickness was increased in a pattern of mild LVH. Systolic function was normal. The estimated ejection fraction was in the range of 60% to 65%. Wall motion was normal; there were no regional wall motion abnormalities. Features are consistent with a pseudonormal left ventricular filling pattern, with concomitant abnormal relaxation and increased filling pressure (grade 2 diastolic dysfunction).  ------------------------------------------------------------ Aortic valve: Bicuspid; moderately thickened leaflets. Doppler: There was severe stenosis. Moderate to severe regurgitation. VTI ratio of LVOT to aortic valve: 0.17. Indexed valve area: 0.35cm^2/m^2 (VTI). Peak velocity ratio of LVOT to aortic valve: 0.15. Indexed valve area: 0.3cm^2/m^2 (Vmax). Mean gradient: 76mm Hg (S). Peak gradient: Hg (S).  ------------------------------------------------------------ Aorta: Aortic root: The aortic root was normal in size.  ------------------------------------------------------------ Mitral valve: Structurally normal valve. Mobility was not restricted. Doppler: Transvalvular velocity was within the normal range. There was no evidence for stenosis. Trivial regurgitation. Peak gradient: 6mm Hg (D).  ------------------------------------------------------------ Left atrium: The atrium was mildly dilated.  ------------------------------------------------------------ Right ventricle: The cavity size was normal. Systolic function was normal.  ------------------------------------------------------------ Pulmonic valve: Doppler: Transvalvular velocity was within the normal range. There was no evidence for stenosis.  ------------------------------------------------------------ Tricuspid valve: Structurally normal valve. Doppler: Transvalvular velocity was within the normal range.  Mild regurgitation.  ------------------------------------------------------------ Pulmonary artery: Systolic pressure was within the normal range.  ------------------------------------------------------------ Right atrium: The atrium was normal in size.  ------------------------------------------------------------ Pericardium: There was no pericardial  effusion.  ------------------------------------------------------------ Systemic veins: Inferior vena cava: The vessel was normal in size.  ASSESSMENT AND PLAN: Severe Symptomatic Bicuspid Aortic Stenosis Long discussion with the patient and her mother regarding further evaluation and treatment options for her aortic stenosis. She now has symptoms with exertion that are attributable to aortic stenosis and I agree that AVR surgery is indicated. We discussed options of mechanical versus bioprosthetic valve at length, and the patient has essentially made up her mind that she does not want a mechanical valve because of the need for long-term anticoagulation. She understands the risk of bioprosthetic valve degeneration in someone of her age. Considering her age of 42 years old and lack of risk factors for CAD, I do not think she will require cardiac cath before valve surgery. I discussed her case with Dr Silvestre Mesi who will be happy to see her in consultation. I deferred on ordering a CTA of the aorta in case Dr Silvestre Mesi wishes to obtain this test at [facility] so that he can review the miages more easily. The patient's questions have been answered to her satisfaction.  Tonny Bollman 02/11/2012 11:46 PM

## 2012-04-08 ENCOUNTER — Encounter: Payer: Self-pay | Admitting: Cardiology

## 2012-04-08 ENCOUNTER — Ambulatory Visit (INDEPENDENT_AMBULATORY_CARE_PROVIDER_SITE_OTHER): Payer: 59 | Admitting: Cardiology

## 2012-04-08 VITALS — BP 128/78 | HR 88 | Ht 66.0 in | Wt 162.4 lb

## 2012-04-08 DIAGNOSIS — I454 Nonspecific intraventricular block: Secondary | ICD-10-CM

## 2012-04-08 DIAGNOSIS — I350 Nonrheumatic aortic (valve) stenosis: Secondary | ICD-10-CM

## 2012-04-08 DIAGNOSIS — Z952 Presence of prosthetic heart valve: Secondary | ICD-10-CM

## 2012-04-08 DIAGNOSIS — Z953 Presence of xenogenic heart valve: Secondary | ICD-10-CM

## 2012-04-08 DIAGNOSIS — I455 Other specified heart block: Secondary | ICD-10-CM

## 2012-04-08 DIAGNOSIS — I359 Nonrheumatic aortic valve disorder, unspecified: Secondary | ICD-10-CM

## 2012-04-08 NOTE — Progress Notes (Signed)
Shelby Eaton Date of Birth:  1971/01/23 [facility] [address] [place], [place]  [address] [phone]  Fax   [phone]  HPI: This pleasant 42 year old woman is seen back for a post hospital office visit.  She had a past history of bicuspid aortic valve with severe aortic stenosis.  On 02/28/12 she underwent aortic valve replacement at [facility] by Dr.Glower.  She did well.  She has a bovine bioprosthetic valve.  She was also told that her other 3 valves also have slight leaks.  She did not have any postoperative complications or arrhythmias.  She was placed on iron postoperatively but has finished that and was told she did not need to continue the iron any longer after it ran out.  She was placed on metoprolol 25 mg twice a day for 3 months post surgery and then was told that she could stop it.  She will remain on long-term baby aspirin daily.  She is back at work and feeling well.  Current Outpatient Prescriptions  Medication Sig Dispense Refill  . acyclovir (ZOVIRAX) 200 MG capsule Take 200 mg by mouth as needed.      Marland Kitchen aspirin 81 MG tablet Take 81 mg by mouth daily.      . metoprolol tartrate (LOPRESSOR) 25 MG tablet Take 25 mg by mouth 2 (two) times daily.      Marland Kitchen omeprazole (PRILOSEC) 20 MG capsule Take 20 mg by mouth as needed.       No current facility-administered medications for this visit.    No Known Allergies  Patient Active Problem List  Diagnosis  . Aortic stenosis  . S/P aortic valve replacement with bioprosthetic valve  . IVCD (intraventricular conduction defect)    History  Smoking status  . Former Smoker -- 20 years  . Types: Cigarettes  . Quit date: 08/16/2011  Smokeless tobacco  . Never Used    History  Alcohol Use  . Yes    Comment: weekends    No family history on file.  Review of Systems: The patient denies any heat or cold intolerance.  No weight gain or weight loss.  The patient denies headaches or blurry vision.  There  is no cough or sputum production.  The patient denies dizziness.  There is no hematuria or hematochezia.  The patient denies any muscle aches or arthritis.  The patient denies any rash.  The patient denies frequent falling or instability.  There is no history of depression or anxiety.  All other systems were reviewed and are negative.   Physical Exam: Filed Vitals:   04/08/12 1345  BP: 128/78  Pulse: 88   the general appearance reveals a well-developed well-nourished woman in no distress.The head and neck exam reveals pupils equal and reactive.  Extraocular movements are full.  There is no scleral icterus.  The mouth and pharynx are normal.  The neck is supple.  The carotids reveal no bruits.  The jugular venous pressure is normal.  The  thyroid is not enlarged.  There is no lymphadenopathy.  The chest is clear to percussion and auscultation.  There are no rales or rhonchi.  Expansion of the chest is symmetrical.  The precordium is quiet.  The first heart sound is normal.  The second heart sound is physiologically split.  There is a grade 2/6 systolic flow murmur across the prosthetic aortic valve.  There is also a low-frequency diastolic murmur along the left sternal edge suggesting possible pulmonic regurgitation.  There  is no gallop or rub.  The mini thoracotomy incision in the right chest appears to be healing well.  There is no abnormal lift or heave.  The abdomen is soft and nontender.  The bowel sounds are normal.  The liver and spleen are not enlarged.  There are no abdominal masses.  There are no abdominal bruits.  Extremities reveal good pedal pulses.  There is no phlebitis or edema.  There is no cyanosis or clubbing.  Strength is normal and symmetrical in all extremities.  There is no lateralizing weakness.  There are no sensory deficits.  The skin is warm and dry.  There is no rash.   EKG shows normal sinus rhythm with new intraventricular conduction disturbance and right axis  deviation.   Assessment / Plan: Continue on same medication.  Activity as tolerated.  Recheck in 3 months for office visit and EKG and after that consider postoperative echocardiogram.

## 2012-04-08 NOTE — Patient Instructions (Addendum)
Your physician recommends that you continue on your current medications as directed. Please refer to the Current Medication list given to you today.  Your physician recommends that you schedule a follow-up appointment in: 3 month ov/ekg 

## 2012-04-08 NOTE — Assessment & Plan Note (Signed)
The patient is not having any exertional chest pain or shortness of breath.  She is still a little tired but feels like she is gaining strength each week

## 2012-04-08 NOTE — Assessment & Plan Note (Signed)
EKG done today shows a new intraventricular conduction disturbance which was not present on 01/08/12.

## 2012-04-11 ENCOUNTER — Other Ambulatory Visit (HOSPITAL_COMMUNITY): Payer: 59

## 2012-04-17 ENCOUNTER — Ambulatory Visit: Payer: 59 | Admitting: Cardiology

## 2012-04-19 ENCOUNTER — Ambulatory Visit: Payer: 59 | Admitting: Cardiology

## 2012-05-08 ENCOUNTER — Telehealth: Payer: Self-pay | Admitting: *Deleted

## 2012-05-08 NOTE — Telephone Encounter (Signed)
Received a call from patient dentist, in chair for deep cleaning. Patient is s/p AVR and they were wondering if ok to go ahead and clean with surgery being so recent. Recommended patient be 6 months post surgery before cleaning. Dental office will reschedule, aware patient needs premed.

## 2012-05-08 NOTE — Telephone Encounter (Signed)
Agree.  Reschedule after 6 months.  She does need SBE prophylaxis.

## 2012-06-05 ENCOUNTER — Telehealth: Payer: Self-pay | Admitting: Cardiology

## 2012-06-05 NOTE — Telephone Encounter (Signed)
Okay to try to leave off the metoprolol altogether and see if that helps her weight situation.  If her heart races too much she will need to go back on it however.

## 2012-06-05 NOTE — Telephone Encounter (Signed)
New Prob      Pt has a questions regarding METOPROLOL prescription. Please call.

## 2012-06-05 NOTE — Telephone Encounter (Signed)
[facility] sent her home with Metoprolol for 3 months and trying to decrease to discontinue altogether. Having racing heart rate 90-115, some chest discomfort. Originally sent home on 25 mg bid decreasing to 1/2 bid x 1 week and then 1/2 daily for a week. Has appointment mid June. Concerned with these symptoms. Weight up 6 pounds since April and thinks coming from Metoprolol. Denies swelling or shortness of breath. Will forward to  Dr. Patty Sermons for review

## 2012-06-05 NOTE — Telephone Encounter (Signed)
Advised patient

## 2012-07-03 ENCOUNTER — Telehealth: Payer: Self-pay | Admitting: Cardiology

## 2012-07-03 NOTE — Telephone Encounter (Signed)
New Problem:    Patient called in because she had a heart valve replacement procedure in February, has stopped taking her metoprolol and was wondering how long the fluttering should last before she should become concerned.  Patient had a 3 hour episode last night and her BPM were 90-110 and would like to know how to proceed.  Please call back.

## 2012-07-03 NOTE — Telephone Encounter (Signed)
Advised per Dr. Yevonne Pax note in chart that she can take metoprolol if needed for palpitations or fluttering.  Pt reminded of her upcoming appt with Dr. Patty Sermons next week.

## 2012-07-09 ENCOUNTER — Encounter: Payer: Self-pay | Admitting: Cardiology

## 2012-07-09 ENCOUNTER — Ambulatory Visit (INDEPENDENT_AMBULATORY_CARE_PROVIDER_SITE_OTHER): Payer: 59 | Admitting: Cardiology

## 2012-07-09 VITALS — BP 128/78 | HR 84 | Resp 12 | Ht 66.0 in | Wt 162.0 lb

## 2012-07-09 DIAGNOSIS — I454 Nonspecific intraventricular block: Secondary | ICD-10-CM

## 2012-07-09 DIAGNOSIS — Z952 Presence of prosthetic heart valve: Secondary | ICD-10-CM

## 2012-07-09 DIAGNOSIS — I455 Other specified heart block: Secondary | ICD-10-CM

## 2012-07-09 DIAGNOSIS — Z953 Presence of xenogenic heart valve: Secondary | ICD-10-CM

## 2012-07-09 DIAGNOSIS — Z954 Presence of other heart-valve replacement: Secondary | ICD-10-CM

## 2012-07-09 NOTE — Progress Notes (Signed)
Shelby Eaton Date of Birth:  [birth year] [facility] [address] [place], [place]  [address] [phone]  Fax   [phone]  HPI: This pleasant 42 year old woman is seen back for a post hospital office visit. She had a past history of bicuspid aortic valve with severe aortic stenosis. On 02/28/12 she underwent aortic valve replacement at [facility] by Dr.Glower. She did well. She has a bovine bioprosthetic valve. She was also told that her other 3 valves also have slight leaks. She did not have any postoperative complications or arrhythmias. She was placed on iron postoperatively but has finished that and was told she did not need to continue the iron any longer after it ran out. She was placed on metoprolol 25 mg twice a day for 3 months post surgery and then was told that she could stop it. She will remain on long-term baby aspirin daily. She is back at work and feeling well.   Current Outpatient Prescriptions  Medication Sig Dispense Refill  . aspirin 81 MG tablet Take 81 mg by mouth daily.       No current facility-administered medications for this visit.    No Known Allergies  Patient Active Problem List   Diagnosis Date Noted  . S/P aortic valve replacement with bioprosthetic valve 04/08/2012  . IVCD (intraventricular conduction defect) 04/08/2012  . Aortic stenosis 10/24/2011    History  Smoking status  . Former Smoker -- 20 years  . Types: Cigarettes  . Quit date: 08/16/2011  Smokeless tobacco  . Never Used    History  Alcohol Use  . Yes    Comment: weekends    No family history on file.  Review of Systems: The patient denies any heat or cold intolerance.  No weight gain or weight loss.  The patient denies headaches or blurry vision.  There is no cough or sputum production.  The patient denies dizziness.  There is no hematuria or hematochezia.  The patient denies any muscle aches or arthritis.  The patient denies any rash.  The patient denies  frequent falling or instability.  There is no history of depression or anxiety.  All other systems were reviewed and are negative.   Physical Exam: Filed Vitals:   07/09/12 1319  BP: 128/78  Pulse: 84  Resp: 12   the general appearance reveals a healthy-appearing woman in no distress.The head and neck exam reveals pupils equal and reactive.  Extraocular movements are full.  There is no scleral icterus.  The mouth and pharynx are normal.  The neck is supple.  The carotids reveal no bruits.  The jugular venous pressure is normal.  The  thyroid is not enlarged.  There is no lymphadenopathy.  The chest is clear to percussion and auscultation.  There are no rales or rhonchi.  Expansion of the chest is symmetrical.  The precordium is quiet.  The first heart sound is normal.  The second heart sound is physiologically split.  There is a grade 2/6 systolic ejection murmur across the prosthetic aortic valve radiating to the neck.  There is also a soft decrescendo diastolic murmur along the left sternal edge.  There is no abnormal lift or heave.  The abdomen is soft and nontender.  The bowel sounds are normal.  The liver and spleen are not enlarged.  There are no abdominal masses.  There are no abdominal bruits.  Extremities reveal good pedal pulses.  There is no phlebitis or edema.  There is no cyanosis  or clubbing.  Strength is normal and symmetrical in all extremities.  There is no lateralizing weakness.  There are no sensory deficits.  The skin is warm and dry.  There is no rash.   EKG shows normal sinus rhythm with interventricular conduction disturbance.  Since previous EKG the axis is no longer rightward.   Assessment / Plan: The patient was reminded about the need for SBE prophylaxis at the time of dental work.  She has a dental appointment for September. We will get a postoperative two-dimensional echocardiogram soon. Recheck in 4 months for followup office visit and EKG.  Continue daily  aspirin.

## 2012-07-09 NOTE — Assessment & Plan Note (Signed)
Her postoperative EKGs have shown interventricular conduction disturbance.  She notes occasional fast heart rate but it goes only 90 up to 110 beats per minute.  It usually occurs several hours after she has finished exercising.  She is not having any problems while exercising.

## 2012-07-09 NOTE — Patient Instructions (Addendum)
Your physician has requested that you have an echocardiogram. Echocardiography is a painless test that uses sound waves to create images of your heart. It provides your doctor with information about the size and shape of your heart and how well your heart's chambers and valves are working. This procedure takes approximately one hour. There are no restrictions for this procedure.  Your physician recommends that you continue on your current medications as directed. Please refer to the Current Medication list given to you today.   Your physician wants you to follow-up in: 4 months with ekg You will receive a reminder letter in the mail two months in advance. If you don't receive a letter, please call our office to schedule the follow-up appointment.

## 2012-07-09 NOTE — Assessment & Plan Note (Signed)
Her energy level is good.  She is not having any symptoms of exertional chest discomfort or dizziness or syncope.

## 2012-07-12 ENCOUNTER — Other Ambulatory Visit: Payer: Self-pay | Admitting: *Deleted

## 2012-07-12 DIAGNOSIS — Z952 Presence of prosthetic heart valve: Secondary | ICD-10-CM

## 2012-07-12 NOTE — Progress Notes (Signed)
Echo ordered/ per last ov request.

## 2012-07-15 ENCOUNTER — Ambulatory Visit (HOSPITAL_COMMUNITY): Payer: 59 | Attending: Cardiology | Admitting: Radiology

## 2012-07-15 DIAGNOSIS — I359 Nonrheumatic aortic valve disorder, unspecified: Secondary | ICD-10-CM

## 2012-07-15 DIAGNOSIS — Z09 Encounter for follow-up examination after completed treatment for conditions other than malignant neoplasm: Secondary | ICD-10-CM | POA: Insufficient documentation

## 2012-07-15 DIAGNOSIS — Z952 Presence of prosthetic heart valve: Secondary | ICD-10-CM

## 2012-07-15 DIAGNOSIS — F17200 Nicotine dependence, unspecified, uncomplicated: Secondary | ICD-10-CM | POA: Insufficient documentation

## 2012-07-15 NOTE — Progress Notes (Signed)
Echocardiogram performed.  

## 2012-07-18 ENCOUNTER — Telehealth: Payer: Self-pay | Admitting: *Deleted

## 2012-07-18 NOTE — Telephone Encounter (Signed)
Message copied by Burnell Blanks on Thu Jul 18, 2012  2:13 PM ------      Message from: Cassell Clement      Created: Tue Jul 16, 2012 12:32 PM       Please report.  The prosthetic valve is working satisfactorily. Gradients across valve are slightly higher than expected but still acceptable. The LV systolic function is normal 50-55%. Continue regular exercise etc. We will probably do another echo in about a year. ------

## 2012-07-18 NOTE — Telephone Encounter (Signed)
Advised patient of echo  

## 2012-08-01 ENCOUNTER — Other Ambulatory Visit: Payer: Self-pay

## 2012-08-01 DIAGNOSIS — Z1231 Encounter for screening mammogram for malignant neoplasm of breast: Secondary | ICD-10-CM

## 2012-08-21 ENCOUNTER — Ambulatory Visit
Admission: RE | Admit: 2012-08-21 | Discharge: 2012-08-21 | Disposition: A | Discharge status: Home / Self Care | Payer: 59 | Source: Ambulatory Visit

## 2012-08-21 DIAGNOSIS — Z1231 Encounter for screening mammogram for malignant neoplasm of breast: Secondary | ICD-10-CM

## 2012-08-22 ENCOUNTER — Other Ambulatory Visit: Payer: Self-pay | Admitting: Obstetrics and Gynecology

## 2012-08-22 DIAGNOSIS — R928 Other abnormal and inconclusive findings on diagnostic imaging of breast: Secondary | ICD-10-CM

## 2012-09-06 ENCOUNTER — Ambulatory Visit
Admission: RE | Admit: 2012-09-06 | Discharge: 2012-09-06 | Disposition: A | Discharge status: Home / Self Care | Payer: 59 | Source: Ambulatory Visit | Attending: Obstetrics and Gynecology | Admitting: Obstetrics and Gynecology

## 2012-09-06 DIAGNOSIS — R928 Other abnormal and inconclusive findings on diagnostic imaging of breast: Secondary | ICD-10-CM

## 2012-11-12 ENCOUNTER — Encounter: Payer: Self-pay | Admitting: Cardiology

## 2012-11-12 ENCOUNTER — Ambulatory Visit (INDEPENDENT_AMBULATORY_CARE_PROVIDER_SITE_OTHER): Payer: 59 | Admitting: Cardiology

## 2012-11-12 VITALS — BP 114/78 | HR 69 | Ht 66.0 in | Wt 158.0 lb

## 2012-11-12 DIAGNOSIS — I454 Nonspecific intraventricular block: Secondary | ICD-10-CM

## 2012-11-12 DIAGNOSIS — Z953 Presence of xenogenic heart valve: Secondary | ICD-10-CM

## 2012-11-12 DIAGNOSIS — I455 Other specified heart block: Secondary | ICD-10-CM

## 2012-11-12 DIAGNOSIS — Z952 Presence of prosthetic heart valve: Secondary | ICD-10-CM

## 2012-11-12 NOTE — Patient Instructions (Signed)
Your physician recommends that you continue on your current medications as directed. Please refer to the Current Medication list given to you today. Your physician wants you to follow-up in: 4 months You will receive a reminder letter in the mail two months in advance. If you don't receive a letter, please call our office to schedule the follow-up appointment.  

## 2012-11-12 NOTE — Assessment & Plan Note (Signed)
EKG today shows interventricular conduction disturbance which is new since surgery but is unchanged since last EKG.  She's not having any dizzy spells or syncope.  No palpitations or tachycardia arrhythmias.

## 2012-11-12 NOTE — Progress Notes (Signed)
Shelby Eaton Date of Birth:  12/27/1970 [address] [place], [place]  [address] [phone]  Fax   [phone]  HPI: This pleasant 43 year old woman is seen back for a post hospital office visit. She had a past history of bicuspid aortic valve with severe aortic stenosis. On 02/28/12 she underwent aortic valve replacement at [facility] by Dr.Glower. She did well. She has a bovine bioprosthetic valve. She was also told that her other 3 valves also have slight leaks. She did not have any postoperative complications or arrhythmias. She was placed on iron postoperatively but has finished that and was told she did not need to continue the iron any longer after it ran out. She was placed on metoprolol 25 mg twice a day for 3 months post surgery and then was told that she could stop it. She will remain on long-term baby aspirin daily. She is back at work and feeling well.  Since last visit she has had no new cardiac symptoms.  She is walking for exercise.  She is not doing any strenuous aerobic activity.  She is not having any chest pain or shortness of breath.  Current Outpatient Prescriptions  Medication Sig Dispense Refill  . aspirin 81 MG tablet Take 81 mg by mouth daily.       No current facility-administered medications for this visit.    No Known Allergies  Patient Active Problem List   Diagnosis Date Noted  . S/P aortic valve replacement with bioprosthetic valve 04/08/2012  . IVCD (intraventricular conduction defect) 04/08/2012  . Aortic stenosis 10/24/2011    History  Smoking status  . Former Smoker -- 20 years  . Types: Cigarettes  . Quit date: 08/16/2011  Smokeless tobacco  . Never Used    History  Alcohol Use  . Yes    Comment: weekends    No family history on file.  Review of Systems: The patient denies any heat or cold intolerance.  No weight gain or weight loss.  The patient denies headaches or blurry vision.  There is no cough or sputum production.   The patient denies dizziness.  There is no hematuria or hematochezia.  The patient denies any muscle aches or arthritis.  The patient denies any rash.  The patient denies frequent falling or instability.  There is no history of depression or anxiety.  All other systems were reviewed and are negative.   Physical Exam: Filed Vitals:   11/12/12 1405  BP: 114/78  Pulse: 69   the general appearance reveals a healthy-appearing woman in no distress.The head and neck exam reveals pupils equal and reactive.  Extraocular movements are full.  There is no scleral icterus.  The mouth and pharynx are normal.  The neck is supple.  The carotids reveal no bruits.  The jugular venous pressure is normal.  The  thyroid is not enlarged.  There is no lymphadenopathy.  The chest is clear to percussion and auscultation.  There are no rales or rhonchi.  Expansion of the chest is symmetrical.  The precordium is quiet.  The first heart sound is normal.  The second heart sound is physiologically split.  There is a grade 2/6 systolic ejection murmur across the prosthetic aortic valve radiating to the neck.  There is also a soft decrescendo diastolic murmur along the left sternal edge.  There is no abnormal lift or heave.  The abdomen is soft and nontender.  The bowel sounds are normal.  The liver and  spleen are not enlarged.  There are no abdominal masses.  There are no abdominal bruits.  Extremities reveal good pedal pulses.  There is no phlebitis or edema.  There is no cyanosis or clubbing.  Strength is normal and symmetrical in all extremities.  There is no lateralizing weakness.  There are no sensory deficits.  The skin is warm and dry.  There is no rash.   EKG shows normal sinus rhythm with interventricular conduction disturbance.  No significant change since previous tracing of 07/09/12.   Assessment / Plan: The patient will continue baby aspirin 1 daily as her only regular medication.  She will continue to walk for  exercise.  She has lost 4 pounds since last visit and is trying to lose weight.  She will return in 4 months for followup office visit.

## 2012-11-12 NOTE — Assessment & Plan Note (Signed)
Her postoperative echo showed higher than expected systolic gradient across the prosthetic aortic valve.  She also has a mild periprosthetic leak by echo.  She is not having any symptoms of congestive heart failure.  She has seen her dentist and uses clindamycin prior to dental work for SBE prophylaxis.

## 2013-04-28 ENCOUNTER — Ambulatory Visit (INDEPENDENT_AMBULATORY_CARE_PROVIDER_SITE_OTHER): Payer: 59 | Admitting: Cardiology

## 2013-04-28 ENCOUNTER — Encounter: Payer: Self-pay | Admitting: Cardiology

## 2013-04-28 VITALS — BP 120/84 | HR 67 | Ht 66.0 in | Wt 164.0 lb

## 2013-04-28 DIAGNOSIS — Z953 Presence of xenogenic heart valve: Secondary | ICD-10-CM

## 2013-04-28 DIAGNOSIS — I455 Other specified heart block: Secondary | ICD-10-CM

## 2013-04-28 DIAGNOSIS — R0789 Other chest pain: Secondary | ICD-10-CM | POA: Insufficient documentation

## 2013-04-28 DIAGNOSIS — I454 Nonspecific intraventricular block: Secondary | ICD-10-CM

## 2013-04-28 DIAGNOSIS — Z952 Presence of prosthetic heart valve: Secondary | ICD-10-CM

## 2013-04-28 NOTE — Assessment & Plan Note (Signed)
Her intraventricular conduction disturbance has been present since our initial postop EKG although the degree of right axis deviation on today's tracing is more accentuated.  She remains in normal sinus rhythm.

## 2013-04-28 NOTE — Patient Instructions (Signed)
Your physician recommends that you continue on your current medications as directed. Please refer to the Current Medication list given to you today.  Your physician has requested that you have an echocardiogram. Echocardiography is a painless test that uses sound waves to create images of your heart. It provides your doctor with information about the size and shape of your heart and how well your heart's chambers and valves are working. This procedure takes approximately one hour. There are no restrictions for this procedure.  Your physician wants you to follow-up in: 4 months with Dr. Mare Ferrari. You will receive a reminder letter in the mail two months in advance. If you don't receive a letter, please call our office to schedule the follow-up appointment.

## 2013-04-28 NOTE — Assessment & Plan Note (Signed)
Her last echocardiogram on 07/16/12 showed an ejection fraction of 50-55%.  Her systolic gradients across the bioprosthetic aortic valve were higher than normally expected with a peak of 37 and a mean of 23. We will have her get another echocardiogram to reassess the status of her aortic valve replacement and to see if we find any cause for her occasional chest discomfort at rest.

## 2013-04-28 NOTE — Assessment & Plan Note (Signed)
She has occasional chest tightness and aching at rest but not with exertion.  Her electrocardiogram today shows an intraventricular conduction disturbance which has been present since her initial postop EKG.  The right axis deviation is more accentuated on today's EKG.

## 2013-04-28 NOTE — Progress Notes (Signed)
Shelby Eaton Date of Birth:  06/30/1970 [address] [place], [place]  [address] [phone]  Fax   [phone]  HPI: This pleasant 43 year old woman is seen back for a post hospital office visit. She had a past history of bicuspid aortic valve with severe aortic stenosis. On 02/28/12 she underwent aortic valve replacement at [facility] by [place]. She did well. She has a bovine bioprosthetic valve. She was also told that her other 3 valves also have slight leaks. She did not have any postoperative complications or arrhythmias. She was placed on iron postoperatively but has finished that and was told she did not need to continue the iron any longer after it ran out. She was placed on metoprolol 25 mg twice a day for 3 months post surgery and then was told that she could stop it. She will remain on long-term baby aspirin daily.  Generally she has been feeling well.  She goes power walking with a coworker each day at 3:00 for about 20 minutes.  They walk at a brisk pacing she does not have any exertional symptoms.  However often at the end of the day in the evening when she is relaxed she notes a aching discomfort in her substernal area which may last anywhere from 15 minutes up to an hour.  The tightness or aching is not severe enough to prevent her from going about her normal activities and it disappears without specific therapy.  There is no radiation.  There is no diaphoresis or dyspnea associated and there is no pleuritic component. Current Outpatient Prescriptions  Medication Sig Dispense Refill  . aspirin 81 MG tablet Take 81 mg by mouth daily.       No current facility-administered medications for this visit.    No Known Allergies  Patient Active Problem List   Diagnosis Date Noted  . Feeling of chest tightness 04/28/2013  . S/P aortic valve replacement with bioprosthetic valve 04/08/2012  . IVCD (intraventricular conduction defect) 04/08/2012  . Aortic stenosis  10/24/2011    History  Smoking status  . Former Smoker -- 20 years  . Types: Cigarettes  . Quit date: 08/16/2011  Smokeless tobacco  . Never Used    History  Alcohol Use  . Yes    Comment: weekends    No family history on file.  Review of Systems: The patient denies any heat or cold intolerance.  No weight gain or weight loss.  The patient denies headaches or blurry vision.  There is no cough or sputum production.  The patient denies dizziness.  There is no hematuria or hematochezia.  The patient denies any muscle aches or arthritis.  The patient denies any rash.  The patient denies frequent falling or instability.  There is no history of depression or anxiety.  All other systems were reviewed and are negative.   Physical Exam: Filed Vitals:   04/28/13 1531  BP: 120/84  Pulse: 67   the general appearance reveals a healthy-appearing woman in no distress.The head and neck exam reveals pupils equal and reactive.  Extraocular movements are full.  There is no scleral icterus.  The mouth and pharynx are normal.  The neck is supple.  The carotids reveal no bruits.  The jugular venous pressure is normal.  The  thyroid is not enlarged.  There is no lymphadenopathy.  The chest is clear to percussion and auscultation.  There are no rales or rhonchi.  Expansion of the chest is symmetrical.  The precordium is quiet.  The first heart sound is normal.  The second heart sound is physiologically split.  There is a grade 2/6 systolic ejection murmur across the prosthetic aortic valve radiating to the neck.  There is also a soft decrescendo diastolic murmur along the left sternal edge.  There is no abnormal lift or heave.  The abdomen is soft and nontender.  The bowel sounds are normal.  The liver and spleen are not enlarged.  There are no abdominal masses.  There are no abdominal bruits.  Extremities reveal good pedal pulses.  There is no phlebitis or edema.  There is no cyanosis or clubbing.  Strength is  normal and symmetrical in all extremities.  There is no lateralizing weakness.  There are no sensory deficits.  The skin is warm and dry.  There is no rash.   EKG shows normal sinus rhythm with interventricular conduction disturbance.  Since previous EKG, the degree of right axis deviation is accentuated.   Assessment / Plan: Continue baby aspirin.  This is her only medication.  Return for a two-dimensional echocardiogram to follow aortic valve bioprosthetic valve. Continue full activity.  Her weight is up 6 pounds and I have encouraged her to lose weight.  Return in 4 months for followup office visit

## 2013-05-09 ENCOUNTER — Ambulatory Visit (HOSPITAL_COMMUNITY): Payer: 59 | Attending: Cardiology | Admitting: Radiology

## 2013-05-09 ENCOUNTER — Encounter: Payer: Self-pay | Admitting: Cardiology

## 2013-05-09 DIAGNOSIS — Z952 Presence of prosthetic heart valve: Secondary | ICD-10-CM | POA: Insufficient documentation

## 2013-05-09 DIAGNOSIS — R0789 Other chest pain: Secondary | ICD-10-CM | POA: Insufficient documentation

## 2013-05-09 DIAGNOSIS — I359 Nonrheumatic aortic valve disorder, unspecified: Secondary | ICD-10-CM

## 2013-05-09 DIAGNOSIS — Z953 Presence of xenogenic heart valve: Secondary | ICD-10-CM

## 2013-05-09 DIAGNOSIS — R072 Precordial pain: Secondary | ICD-10-CM

## 2013-05-09 NOTE — Progress Notes (Signed)
Echocardiogram performed.  

## 2013-05-12 ENCOUNTER — Telehealth: Payer: Self-pay | Admitting: Cardiology

## 2013-05-12 NOTE — Telephone Encounter (Signed)
New message ° ° ° ° ° °Returning a nurses call to get echo results °

## 2013-05-12 NOTE — Telephone Encounter (Signed)
Patient given echo results from 05/09/13

## 2013-12-17 ENCOUNTER — Encounter: Payer: Self-pay | Admitting: Cardiology

## 2013-12-17 ENCOUNTER — Ambulatory Visit
Admission: RE | Admit: 2013-12-17 | Discharge: 2013-12-17 | Disposition: A | Discharge status: Home / Self Care | Payer: 59 | Source: Ambulatory Visit | Attending: Cardiology | Admitting: Cardiology

## 2013-12-17 ENCOUNTER — Ambulatory Visit (INDEPENDENT_AMBULATORY_CARE_PROVIDER_SITE_OTHER): Payer: 59 | Admitting: Cardiology

## 2013-12-17 VITALS — BP 114/80 | HR 61 | Ht 66.0 in | Wt 161.0 lb

## 2013-12-17 DIAGNOSIS — F419 Anxiety disorder, unspecified: Secondary | ICD-10-CM | POA: Insufficient documentation

## 2013-12-17 DIAGNOSIS — Z953 Presence of xenogenic heart valve: Secondary | ICD-10-CM

## 2013-12-17 DIAGNOSIS — R0789 Other chest pain: Secondary | ICD-10-CM

## 2013-12-17 DIAGNOSIS — I454 Nonspecific intraventricular block: Secondary | ICD-10-CM

## 2013-12-17 DIAGNOSIS — Z954 Presence of other heart-valve replacement: Secondary | ICD-10-CM

## 2013-12-17 NOTE — Assessment & Plan Note (Signed)
She has felt quite anxious recently.  We will prescribe Xanax 0.25 mg every 6 hours as needed for anxiety #30 refill 5.  She will only use it if she really needs it.  She does not want to get started on any long-term nerve medication.

## 2013-12-17 NOTE — Progress Notes (Signed)
Shelby Eaton Date of Birth:  02-02-1970 [address] [place], [place]  [address] [phone]  Fax   [phone]  HPI: This pleasant 43 year old woman is seen back for a work in office visit. She had a past history of bicuspid aortic valve with severe aortic stenosis. On 02/28/12 she underwent aortic valve replacement at [facility] by [place]. She did well. She has a bovine bioprosthetic valve. She was also told that her other 3 valves also have slight leaks. She did not have any postoperative complications or arrhythmias. She was placed on iron postoperatively but has finished that and was told she did not need to continue the iron any longer after it ran out. She was placed on metoprolol 25 mg twice a day for 3 months post surgery and then was told that she could stop it. She will remain on long-term baby aspirin daily.  She has not been experiencing any exertional chest symptoms.  She has been waking up in the middle of the night with occasional squeezing chest pressure which radiates to the mid back.  It lasted about 30 minutes and then subsides.  This is been going on for the past several weeks.  She has been under more stress.  She is training some new employees at work.  Also her parents are getting divorced after many years of marriage.  The patient herself is having to find a new place to live and move out of her condo.  She has felt more anxious.  She has been so busy at work that she has not been walking with her coworkers for exercise over the past several weeks. Her last echocardiogram was 05/09/13 and showed ejection fraction of 55-60% and mild aortic stenosis with peak gradient 39 and mean gradient of 25. Current Outpatient Prescriptions  Medication Sig Dispense Refill  . acyclovir (ZOVIRAX) 200 MG capsule continuously as needed. Only taken as needed.    Marland Kitchen omeprazole (PRILOSEC) 40 MG capsule Take by mouth.    Marland Kitchen aspirin 81 MG tablet Take 81 mg by mouth daily.     No  current facility-administered medications for this visit.    No Known Allergies  Patient Active Problem List   Diagnosis Date Noted  . Anxiety 12/17/2013  . Feeling of chest tightness 04/28/2013  . S/P aortic valve replacement with bioprosthetic valve 04/08/2012  . IVCD (intraventricular conduction defect) 04/08/2012  . Aortic stenosis 10/24/2011    History  Smoking status  . Former Smoker -- 20 years  . Types: Cigarettes  . Quit date: 08/16/2011  Smokeless tobacco  . Never Used    History  Alcohol Use  . Yes    Comment: weekends    No family history on file.  Review of Systems: The patient denies any heat or cold intolerance.  No weight gain or weight loss.  The patient denies headaches or blurry vision.  There is no cough or sputum production.  The patient denies dizziness.  There is no hematuria or hematochezia.  The patient denies any muscle aches or arthritis.  The patient denies any rash.  The patient denies frequent falling or instability.  There is no history of depression or anxiety.  All other systems were reviewed and are negative.   Physical Exam: Filed Vitals:   12/17/13 0902  BP: 114/80  Pulse: 61   the general appearance reveals a healthy-appearing woman in no distress.The head and neck exam reveals pupils equal and reactive.  Extraocular movements are  full.  There is no scleral icterus.  The mouth and pharynx are normal.  The neck is supple.  The carotids reveal no bruits.  The jugular venous pressure is normal.  The  thyroid is not enlarged.  There is no lymphadenopathy.  The chest is clear to percussion and auscultation.  There are no rales or rhonchi.  Expansion of the chest is symmetrical.  The precordium is quiet.  The first heart sound is normal.  The second heart sound is physiologically split.  There is a grade 2/6 systolic ejection murmur across the prosthetic aortic valve radiating to the neck.  There is also a soft decrescendo diastolic murmur along  the left sternal edge.  There is no abnormal lift or heave.  The abdomen is soft and nontender.  The bowel sounds are normal.  The liver and spleen are not enlarged.  There are no abdominal masses.  There are no abdominal bruits.  Extremities reveal good pedal pulses.  There is no phlebitis or edema.  There is no cyanosis or clubbing.  Strength is normal and symmetrical in all extremities.  There is no lateralizing weakness.  There are no sensory deficits.  The skin is warm and dry.  There is no rash.   EKG shows normal sinus rhythm with interventricular conduction disturbance.  She has right axis deviation.  Since previous tracing of 04/28/13, no significant change.   Impression: 1.  Status post prosthetic aortic valve replacement for bicuspid aortic valve on 02/28/12 at [facility] by Dr. Evelina Dun. 2.  Nocturnal chest pressure possibly related to situational anxiety  Disposition: We will get a postoperative chest x-ray We will give her a trial of Xanax 0.25 mg every 6 hours as needed.  If symptoms persist consider further evaluation such as coronary CT angiogram.  Return in 6 months for office visit and EKG, or sooner if necessary

## 2013-12-17 NOTE — Assessment & Plan Note (Signed)
She is not having any symptoms of congestive heart failure.  Her echocardiogram on 05/09/13 showed an ejection fraction of 55-60%.  There was mild aortic stenosis with peak gradient 39 and mean gradient of 25

## 2013-12-17 NOTE — Patient Instructions (Signed)
START XANAX 0.25MG  1 TABLET EVERY SIX HOURS AS NEED FOR ANXIETY  A chest x-ray takes a picture of the organs and structures inside the chest, including the heart, lungs, and blood vessels. This test can show several things, including, whether the heart is enlarges; whether fluid is building up in the lungs; and whether pacemaker / defibrillator leads are still in place.  [place] BETWEEN 8:30AM AND 4:00PM   Your physician wants you to follow-up in: 6 MONTH. You will receive a reminder letter in the mail two months in advance. If you don't receive a letter, please call our office to schedule the follow-up appointment.

## 2013-12-17 NOTE — Assessment & Plan Note (Signed)
She is having chest tightness at rest waking her out of sleep.  No exertional symptoms.  Her EKG today shows no acute changes.  She has not had a chest x-ray since her aortic valve replacement.  We will obtain a chest x-ray.

## 2014-07-22 ENCOUNTER — Encounter: Payer: Self-pay | Admitting: Cardiology

## 2014-07-22 ENCOUNTER — Ambulatory Visit (INDEPENDENT_AMBULATORY_CARE_PROVIDER_SITE_OTHER): Payer: 59 | Admitting: Cardiology

## 2014-07-22 DIAGNOSIS — Z954 Presence of other heart-valve replacement: Secondary | ICD-10-CM

## 2014-07-22 DIAGNOSIS — Z953 Presence of xenogenic heart valve: Secondary | ICD-10-CM

## 2014-07-22 NOTE — Patient Instructions (Signed)
Medication Instructions:  Your physician recommends that you continue on your current medications as directed. Please refer to the Current Medication list given to you today.  Labwork: NONE  Testing/Procedures: NONE  Follow-Up: Your physician wants you to follow-up in: [place] will receive a reminder letter in the mail two months in advance. If you don't receive a letter, please call our office to schedule the follow-up appointment.

## 2014-07-22 NOTE — Progress Notes (Signed)
Cardiology Office Note   Date:  07/22/2014   ID:  Shelby Eaton, DOB 1970/11/04, MRN [id number]  PCP:  Selinda Orion, MD  Cardiologist: Darlin Coco MD  No chief complaint on file.     History of Present Illness: Shelby Eaton is a 44 y.o. female who presents for a six-month follow-up visit  . She had a past history of bicuspid aortic valve with severe aortic stenosis. On 02/28/12 she underwent aortic valve replacement at [facility] by [place]. She did well. She has a bovine bioprosthetic valve. She was also told that her other 3 valves also have slight leaks. She did not have any postoperative complications or arrhythmias.  When we last saw her 6 months ago she was under a lot of stress.  Her parents were in the midst of a divorce.  She was having to move out of her condo and she has moved into her mother's home.  Presently the mother's kitchen is being remodeled so they are having to eat their meals away from home and she has gained some weight. Her last echocardiogram was 05/09/13 and showed ejection fraction of 55-60% and mild aortic stenosis with peak gradient 39 and mean gradient of 25. Mentally she is under a lot less stress.  Her job is also going better.  She has not had to take any Xanax. She has not been having chest pain or shortness of breath dizziness or syncope or palpitations.  Past Medical History  Diagnosis Date  . Murmur   . Severe aortic valve stenosis CONGENITAL --  CARDIOLOGIST -- DR Raudel Bazen-- LOV IN EPIC   . Aortic valve insufficiency, congenital   . Endometrial polyp   . Vaginal cyst     Past Surgical History  Procedure Laterality Date  . Transthoracic echocardiogram  12-18-2007  DR Jireh Vinas    NORMAL LVSF AND DF/ EF 55-60%/ SEVERE CALCIFIC AORTIC STENOSIS W/ MODERATE - SEVERE  AORTIC INSUFFICIENCY/ MILD MITRAL SCLEROSIS/ NORMAL PULMONARY ARTERY PRESSURE / NO SIGNIFICANT CHANGE SINCE LAST ECHO 06-14-2006     Current Outpatient Prescriptions    Medication Sig Dispense Refill  . acyclovir (ZOVIRAX) 200 MG capsule Take 200 mg by mouth every 4 (four) hours as needed (cold sores). continuously as needed. Only taken as needed.    Marland Kitchen aspirin 81 MG tablet Take 81 mg by mouth daily.    Marland Kitchen omeprazole (PRILOSEC) 40 MG capsule Take 40 mg by mouth daily as needed (acid reflux).      No current facility-administered medications for this visit.    Allergies:   Review of patient's allergies indicates no known allergies.    Social History:  The patient  reports that she quit smoking about 2 years ago. Her smoking use included Cigarettes. She quit after 20 years of use. She has never used smokeless tobacco. She reports that she drinks alcohol. She reports that she does not use illicit drugs.   Family History:  The patient's family history is not on file.  Patient's mother is living with diabetes.  Father has a history of diabetes and TIAs.  ROS:  Please see the history of present illness.   Otherwise, review of systems are positive for none.   All other systems are reviewed and negative.    PHYSICAL EXAM: VS:  BP 106/78 mmHg  Pulse 53  Ht 5\' 6"  (1.676 m)  Wt 164 lb 6.4 oz (74.571 kg)  BMI 26.55 kg/m2 , BMI Body mass index is 26.55 kg/(m^2). GEN: Well  nourished, well developed, in no acute distress HEENT: normal Neck: no JVD, carotid bruits, or masses Cardiac: Rhythm is regular.  There is grade 2/6 harsh systolic ejection murmur at the base.  No diastolic murmur. Respiratory:  clear to auscultation bilaterally, normal work of breathing GI: soft, nontender, nondistended, + BS MS: no deformity or atrophy Skin: warm and dry, no rash Neuro:  Strength and sensation are intact Psych: euthymic mood, full affect   EKG:  EKG is ordered today. The ekg ordered today demonstrates sinus bradycardia and right axis deviation.  No significant change since last visit on 12/17/13   Recent Labs: No results found for requested labs within last 365  days.    Lipid Panel No results found for: CHOL, TRIG, HDL, CHOLHDL, VLDL, LDLCALC, LDLDIRECT    Wt Readings from Last 3 Encounters:  07/22/14 164 lb 6.4 oz (74.571 kg)  12/17/13 161 lb (73.029 kg)  04/28/13 164 lb (74.39 kg)         ASSESSMENT AND PLAN:  1. Status post prosthetic aortic valve replacement for bicuspid aortic valve on 02/28/12 at [facility] by Dr. Evelina Dun. 2. Nocturnal chest pressure possibly related to situational anxiety, significantly improved since last visit.   Continue current medication. Return in 6 months for office visit .   Current medicines are reviewed at length with the patient today.  The patient does not have concerns regarding medicines.  The following changes have been made:  no change  Labs/ tests ordered today include:   Orders Placed This Encounter  Procedures  . EKG 12-Lead       SignedDarlin Coco MD 07/22/2014 10:29 AM    [facility] [place], [place], [place]  [address] Phone: [phone]; Fax: [phone]

## 2015-07-23 NOTE — Progress Notes (Signed)
Cardiology Office Note   Date:  07/26/2015   ID:  AALEIGHA Eaton, DOB July 12, 1970, MRN [id number]  PCP:  No PCP Per Patient  Cardiologist: Darlin Coco MD  Chief Complaint  Patient presents with  . IVCD    no sx, per pt      History of Present Illness: Shelby Eaton is a 46 y.o. female who presents for AV disease New to me previously seen by Dr Mare Ferrari  . She had a past history of bicuspid aortic valve with severe aortic stenosis. On 02/28/12 she underwent aortic valve replacement at [facility] by [place]. She did well. She has a bovine bioprosthetic valve. She was also told that her other 3 valves also have slight leaks. She did not have any postoperative complications or arrhythmias.   January 2015 stressed  Taking xanax at times   Her parents were in the midst of a divorce.  She was having to move out of her condo and she has moved into her mother's home.    Her last echocardiogram was 05/09/13 and showed ejection fraction of 55-60% and mild aortic stenosis with peak gradient 39 and mean gradient of 25.  She has not been having chest pain or shortness of breath dizziness or syncope or palpitations.  Mom died last year had stroke with afib and complications Single Has sister and father in area Works for [employer] last 45 years  [place] / Andrey Spearman and that is how she ended up at [facility] For surgery   Past Medical History  Diagnosis Date  . Murmur   . Severe aortic valve stenosis CONGENITAL --  CARDIOLOGIST -- DR BRACKBILL-- LOV IN EPIC   . Aortic valve insufficiency, congenital   . Endometrial polyp   . Vaginal cyst     Past Surgical History  Procedure Laterality Date  . Transthoracic echocardiogram  12-18-2007  DR BRACKBILL    NORMAL LVSF AND DF/ EF 55-60%/ SEVERE CALCIFIC AORTIC STENOSIS W/ MODERATE - SEVERE  AORTIC INSUFFICIENCY/ MILD MITRAL SCLEROSIS/ NORMAL PULMONARY ARTERY PRESSURE / NO SIGNIFICANT CHANGE SINCE LAST ECHO 06-14-2006      Current Outpatient Prescriptions  Medication Sig Dispense Refill  . acyclovir (ZOVIRAX) 200 MG capsule Take 200 mg by mouth daily as needed. For fever blisters    . aspirin 81 MG tablet Take 81 mg by mouth daily.     No current facility-administered medications for this visit.    Allergies:   Review of patient's allergies indicates no known allergies.    Social History:  The patient  reports that she quit smoking about 3 years ago. Her smoking use included Cigarettes. She quit after 20 years of use. She has never used smokeless tobacco. She reports that she drinks alcohol. She reports that she does not use illicit drugs.   Family History:  The patient's family history is not on file.  Patient's mother is living with diabetes.  Father has a history of diabetes and TIAs.  ROS:  Please see the history of present illness.   Otherwise, review of systems are positive for none.   All other systems are reviewed and negative.    PHYSICAL EXAM: VS:  BP 120/86 mmHg  Pulse 60  Ht 5\' 6"  (1.676 m)  Wt 162 lb (73.483 kg)  BMI 26.16 kg/m2  SpO2 98% , BMI Body mass index is 26.16 kg/(m^2). GEN: Well nourished, well developed, in no acute distress HEENT: normal Neck: no JVD, carotid bruits, or masses  Cardiac: Rhythm is regular.  There is grade 2/6 harsh systolic ejection murmur at the base.  No diastolic murmur. Respiratory:  clear to auscultation bilaterally, normal work of breathing GI: soft, nontender, nondistended, + BS MS: no deformity or atrophy Skin: warm and dry, no rash Neuro:  Strength and sensation are intact Psych: euthymic mood, full affect   EKG:   07/22/14  Sinus bradycardia and right axis deviation.  No significant change since last visit on 12/17/13  07/26/15 SR rate 55 LAE Limb lead reversal    Recent Labs: No results found for requested labs within last 365 days.    Lipid Panel No results found for: CHOL, TRIG, HDL, CHOLHDL, VLDL, LDLCALC, LDLDIRECT    Wt  Readings from Last 3 Encounters:  07/26/15 162 lb (73.483 kg)  07/22/14 164 lb 6.4 oz (74.571 kg)  12/17/13 161 lb (73.029 kg)      ASSESSMENT AND PLAN:  1. Status post prosthetic aortic valve replacement for bicuspid aortic valve on 02/28/12 at [facility] by Dr. Evelina Dun.F/U echo for EF and gradients SBE prophylaxis  2. Nocturnal chest pressure possibly related to situational anxiety, significantly improved since last visit.   Continue current medication. Return in 6 months for office visit .   Current medicines are reviewed at length with the patient today.  The patient does not have concerns regarding medicines.  The following changes have been made:  no change  Jenkins Rouge

## 2015-07-26 ENCOUNTER — Ambulatory Visit (INDEPENDENT_AMBULATORY_CARE_PROVIDER_SITE_OTHER): Payer: 59 | Admitting: Cardiovascular Disease

## 2015-07-26 ENCOUNTER — Encounter: Payer: Self-pay | Admitting: Cardiovascular Disease

## 2015-07-26 VITALS — BP 120/86 | HR 60 | Ht 66.0 in | Wt 162.0 lb

## 2015-07-26 DIAGNOSIS — Z954 Presence of other heart-valve replacement: Secondary | ICD-10-CM

## 2015-07-26 DIAGNOSIS — Z952 Presence of prosthetic heart valve: Secondary | ICD-10-CM

## 2015-07-26 DIAGNOSIS — I454 Nonspecific intraventricular block: Secondary | ICD-10-CM

## 2015-07-26 NOTE — Patient Instructions (Addendum)

## 2015-08-11 ENCOUNTER — Other Ambulatory Visit: Payer: Self-pay

## 2015-08-11 ENCOUNTER — Ambulatory Visit (HOSPITAL_COMMUNITY): Payer: 59 | Attending: Cardiovascular Disease

## 2015-08-11 DIAGNOSIS — I359 Nonrheumatic aortic valve disorder, unspecified: Secondary | ICD-10-CM | POA: Diagnosis present

## 2015-08-11 DIAGNOSIS — I454 Nonspecific intraventricular block: Secondary | ICD-10-CM

## 2015-08-11 DIAGNOSIS — Z87891 Personal history of nicotine dependence: Secondary | ICD-10-CM | POA: Insufficient documentation

## 2015-08-11 DIAGNOSIS — I517 Cardiomegaly: Secondary | ICD-10-CM | POA: Diagnosis not present

## 2015-08-11 DIAGNOSIS — Z953 Presence of xenogenic heart valve: Secondary | ICD-10-CM | POA: Insufficient documentation

## 2015-08-12 ENCOUNTER — Other Ambulatory Visit: Payer: Self-pay | Admitting: Obstetrics and Gynecology

## 2015-08-12 DIAGNOSIS — R928 Other abnormal and inconclusive findings on diagnostic imaging of breast: Secondary | ICD-10-CM

## 2015-09-10 ENCOUNTER — Ambulatory Visit
Admission: RE | Admit: 2015-09-10 | Discharge: 2015-09-10 | Disposition: A | Discharge status: Home / Self Care | Payer: 59 | Source: Ambulatory Visit | Attending: Obstetrics and Gynecology | Admitting: Obstetrics and Gynecology

## 2015-09-10 ENCOUNTER — Other Ambulatory Visit: Payer: Self-pay | Admitting: Obstetrics and Gynecology

## 2015-09-10 DIAGNOSIS — R928 Other abnormal and inconclusive findings on diagnostic imaging of breast: Secondary | ICD-10-CM

## 2015-11-03 ENCOUNTER — Ambulatory Visit (INDEPENDENT_AMBULATORY_CARE_PROVIDER_SITE_OTHER): Payer: 59 | Admitting: Family Medicine

## 2015-11-03 ENCOUNTER — Encounter: Payer: Self-pay | Admitting: Family Medicine

## 2015-11-03 VITALS — BP 122/88 | HR 61 | Temp 98.4°F | Ht 65.5 in | Wt 165.8 lb

## 2015-11-03 DIAGNOSIS — Z13220 Encounter for screening for lipoid disorders: Secondary | ICD-10-CM

## 2015-11-03 DIAGNOSIS — Z1329 Encounter for screening for other suspected endocrine disorder: Secondary | ICD-10-CM | POA: Diagnosis not present

## 2015-11-03 DIAGNOSIS — Z130 Encounter for screening for diseases of the blood and blood-forming organs and certain disorders involving the immune mechanism: Secondary | ICD-10-CM

## 2015-11-03 DIAGNOSIS — Z131 Encounter for screening for diabetes mellitus: Secondary | ICD-10-CM

## 2015-11-03 DIAGNOSIS — Z23 Encounter for immunization: Secondary | ICD-10-CM | POA: Diagnosis not present

## 2015-11-03 DIAGNOSIS — F411 Generalized anxiety disorder: Secondary | ICD-10-CM

## 2015-11-03 MED ORDER — FLUOXETINE HCL 20 MG PO TABS: 20.0000 mg | ORAL_TABLET | Freq: Every day | ORAL | 6 refills | Status: DC

## 2015-11-03 NOTE — Progress Notes (Signed)
[place] at [facility] [address] [place], [place] [address] [id number] [phone]  Date:  11/03/2015   Name:  Shelby Eaton   DOB:  05/07/1970   MRN:  [id number]  PCP:  Lamar Blinks, MD    Chief Complaint: Establish Care (Pt here to est care. c/o stress and anxiety that has been present for years. Pt grinds teeth and her dentist has recommended getting botox injected into the back of head. Will get flu vaccine today. )   History of Present Illness:  Shelby Eaton is a 45 y.o. very pleasant female patient who presents with the following:  Here today as a new patient to establish care.   She had a congenital problem with her aortic valve that developed into stenosis and requited a valve replacement.   History of aortic valve replacement with bioprosthetic valve about 3 years ago.   Her cardiologist is Dr. Johnsie Cancel- last visit was over the summer.   She had been a pt of Dr. Mare Ferrari but he has now retired.   She notes that she has "always been an anxious person,but these past 3 years have been crazy with family and work drama."  She is grinding her teeth to the point of causing damage to her teeth. Her dentist has recommended doing Botox injections to decrease jaw tension but she is not yet sold on this idea yet Her mom and dad are getting divorced which is stressful for her, she also notes stress at work and that she is by nature anxious  She has used xanax at times in the past, never tried anything else for he sx.  She would be interested in trying an SSRI however   She does not feel depressed at all.  No SI She feels like she is anxious 75% of the time.   She lives alone, has an alarm. She will use a OTC sleep aid at night "because I will get worried and hear things."  She is a former smoker but quit a few years ago.    Last labs in 2013- she ate a few hours ago. ' LMP was within the last 30 days   Patient Active Problem List    Diagnosis Date Noted  . Anxiety 12/17/2013  . Feeling of chest tightness 04/28/2013  . S/P aortic valve replacement with bioprosthetic valve 04/08/2012  . IVCD (intraventricular conduction defect) 04/08/2012  . Aortic stenosis 10/24/2011    Past Medical History:  Diagnosis Date  . Aortic valve insufficiency, congenital   . Endometrial polyp   . Murmur   . Severe aortic valve stenosis CONGENITAL --  CARDIOLOGIST -- DR BRACKBILL-- LOV IN EPIC   . Vaginal cyst     Past Surgical History:  Procedure Laterality Date  . TRANSTHORACIC ECHOCARDIOGRAM  12-18-2007  DR BRACKBILL   NORMAL LVSF AND DF/ EF 55-60%/ SEVERE CALCIFIC AORTIC STENOSIS W/ MODERATE - SEVERE  AORTIC INSUFFICIENCY/ MILD MITRAL SCLEROSIS/ NORMAL PULMONARY ARTERY PRESSURE / NO SIGNIFICANT CHANGE SINCE LAST ECHO 06-14-2006    Social History  Substance Use Topics  . Smoking status: Former Smoker    Years: 20.00    Types: Cigarettes    Quit date: 08/16/2011  . Smokeless tobacco: Never Used  . Alcohol use Yes     Comment: weekends    Family History  Problem Relation Age of Onset  . Heart disease Mother   . Stroke Mother   . Diabetes Mother   .  Heart disease Father   . Stroke Father   . Hypertension Father   . Diabetes Father     No Known Allergies  Medication list has been reviewed and updated.  Current Outpatient Prescriptions on File Prior to Visit  Medication Sig Dispense Refill  . acyclovir (ZOVIRAX) 200 MG capsule Take 200 mg by mouth daily as needed. For fever blisters    . aspirin 81 MG tablet Take 81 mg by mouth daily.     No current facility-administered medications on file prior to visit.     Review of Systems:  As per HPI- otherwise negative.   Physical Examination: Blood pressure 122/88, pulse 61, temperature 98.4 F (36.9 C), temperature source Oral, height 5' 5.5" (1.664 m), weight 165 lb 12.8 oz (75.2 kg), last menstrual period 10/16/2015, SpO2 100 %.  Vitals:   11/03/15 1615   Weight: 165 lb 12.8 oz (75.2 kg)  Height: 5' 5.5" (1.664 m)   Body mass index is 27.17 kg/m. Ideal Body Weight: Weight in (lb) to have BMI = 25: 152.2  GEN: WDWN, NAD, Non-toxic, A & O x 3, looks well, mild overweight HEENT: Atraumatic, Normocephalic. Neck supple. No masses, No LAD.  Bilateral TM wnl, oropharynx normal.  PEERL,EOMI.   Ears and Nose: No external deformity. CV: RRR, No M/G/R. No JVD. No thrill. No extra heart sounds. PULM: CTA B, no wheezes, crackles, rhonchi. No retractions. No resp. distress. No accessory muscle use. EXTR: No c/c/e NEURO Normal gait.  PSYCH: Normally interactive. Conversant. Not depressed or anxious appearing.  Calm demeanor.    Assessment and Plan: Screening for deficiency anemia - Plan: CBC  Screening for thyroid disorder - Plan: TSH  Screening for diabetes mellitus - Plan: Comprehensive metabolic panel, Hemoglobin A1C  Screening for hyperlipidemia - Plan: Lipid panel  GAD (generalized anxiety disorder) - Plan: FLUoxetine (PROZAC) 20 MG tablet  Encounter for immunization - Plan: Flu Vaccine QUAD 36+ mos IM  She is overdue for labs and would like to have BW today- she is not fasting Flu shot Will try a low dose of SSRI for her anxiety- she will plan to see me in 2-3 months for a recheck, will let me know sooner if any problems or concerns   Signed Lamar Blinks, MD

## 2015-11-03 NOTE — Patient Instructions (Signed)
It was very nice to meet you today. Let's try a low dose of prozac for your anxiety- take 20 mg once a day. My hope is that over the next several weeks you will notice decreased anxiety. Let me know if you have any bothersome side effects or any other concerns.  We will also do your basic labs for you today and I will be in touch with your results asap

## 2015-11-03 NOTE — Progress Notes (Signed)
Pre visit review using our clinic review tool, if applicable. No additional management support is needed unless otherwise documented below in the visit note. 

## 2015-11-04 ENCOUNTER — Encounter: Payer: Self-pay | Admitting: Family Medicine

## 2015-11-04 LAB — COMPREHENSIVE METABOLIC PANEL
ALBUMIN: 4.3 g/dL (ref 3.5–5.2)
ALK PHOS: 48 U/L (ref 39–117)
ALT: 15 U/L (ref 0–35)
AST: 17 U/L (ref 0–37)
BUN: 15 mg/dL (ref 6–23)
CALCIUM: 9.4 mg/dL (ref 8.4–10.5)
CHLORIDE: 105 meq/L (ref 96–112)
CO2: 29 mEq/L (ref 19–32)
Creatinine, Ser: 0.85 mg/dL (ref 0.40–1.20)
GFR: 77.12 mL/min (ref >60.00)
Glucose, Bld: 100 mg/dL — ABNORMAL HIGH (ref 70–99)
POTASSIUM: 4.5 meq/L (ref 3.5–5.1)
Sodium: 139 mEq/L (ref 135–145)
TOTAL PROTEIN: 7.6 g/dL (ref 6.0–8.3)
Total Bilirubin: 0.2 mg/dL (ref 0.2–1.2)

## 2015-11-04 LAB — LIPID PANEL
CHOLESTEROL: 171 mg/dL (ref 0–200)
HDL: 59.1 mg/dL (ref >39.00)
LDL CALC: 88 mg/dL (ref 0–99)
NonHDL: 111.44
TRIGLYCERIDES: 118 mg/dL (ref 0.0–149.0)
Total CHOL/HDL Ratio: 3
VLDL: 23.6 mg/dL (ref 0.0–40.0)

## 2015-11-04 LAB — CBC
HEMATOCRIT: 39.2 % (ref 36.0–46.0)
HEMOGLOBIN: 13 g/dL (ref 12.0–15.0)
MCHC: 33.1 g/dL (ref 30.0–36.0)
MCV: 86.2 fl (ref 78.0–100.0)
PLATELETS: 259 10*3/uL (ref 150.0–400.0)
RBC: 4.55 Mil/uL (ref 3.87–5.11)
RDW: 12.8 % (ref 11.5–15.5)
WBC: 9.2 10*3/uL (ref 4.0–10.5)

## 2015-11-04 LAB — HEMOGLOBIN A1C: HEMOGLOBIN A1C: 5.7 % (ref 4.6–6.5)

## 2015-11-04 LAB — TSH: TSH: 2.08 u[IU]/mL (ref 0.35–4.50)

## 2016-02-07 ENCOUNTER — Encounter: Payer: Self-pay | Admitting: Family Medicine

## 2016-02-07 ENCOUNTER — Ambulatory Visit (INDEPENDENT_AMBULATORY_CARE_PROVIDER_SITE_OTHER): Payer: 59 | Admitting: Family Medicine

## 2016-02-07 DIAGNOSIS — F411 Generalized anxiety disorder: Secondary | ICD-10-CM | POA: Diagnosis not present

## 2016-02-07 MED ORDER — FLUOXETINE HCL 40 MG PO CAPS: 40.0000 mg | ORAL_CAPSULE | Freq: Every day | ORAL | 3 refills | Status: DC

## 2016-02-07 NOTE — Patient Instructions (Signed)
Let's increase your prozac to 40 mg Please let me know if this does not help or if you have any change or worsening of your symptoms Please come and see me in 3-4 months for a recheck- Sooner if worse.

## 2016-02-07 NOTE — Progress Notes (Signed)
Pre visit review using our clinic review tool, if applicable. No additional management support is needed unless otherwise documented below in the visit note. 

## 2016-02-07 NOTE — Progress Notes (Signed)
[place] at [facility] [address], [place], [place] [address] [id number] [phone]  Date:  02/07/2016   Name:  Shelby Eaton   DOB:  March 14, 1970   MRN:  [id number]  PCP:  Lamar Blinks, MD    Chief Complaint: Follow-up (Pt here for 2-3 month follow up on Prozac. Pt feels like this med helps with certain sx's and not with other. )   History of Present Illness:  Shelby Eaton is a 46 y.o. very pleasant female patient who presents with the following:  She is here today to recheck on her prozac.  The first couple of weeks she noted fatigue but this is now resolved. She does feel that it is helpful for her overall 75% of the time she does not need OTC sleep aids.  She "still feels tense some of the time" However her OCD tendencies are better.  She does not feel the need to check things as much However she still feels like she is hypervigilant she will notice herself tensing up for no reason "like someone is about to hit me."  She does this even when she is relaxed and at home  She did have some sadness around the holidays as it was her first Christmas since her mom passed away.  She now feels like her depression is ok and that she was just sad due to the holiday season.   Appetite is ok- she overate from the holidays but plans to get back on track.    She has a history of aortic valve replacement- otherwise her health history has been benign No SI  LMP 01/24/16 Patient Active Problem List   Diagnosis Date Noted  . Anxiety 12/17/2013  . Feeling of chest tightness 04/28/2013  . S/P aortic valve replacement with bioprosthetic valve 04/08/2012  . IVCD (intraventricular conduction defect) 04/08/2012  . Aortic stenosis 10/24/2011    Past Medical History:  Diagnosis Date  . Aortic valve insufficiency, congenital   . Endometrial polyp   . Murmur   . Severe aortic valve stenosis CONGENITAL --  CARDIOLOGIST -- DR BRACKBILL-- LOV IN EPIC   .  Vaginal cyst     Past Surgical History:  Procedure Laterality Date  . TRANSTHORACIC ECHOCARDIOGRAM  12-18-2007  DR BRACKBILL   NORMAL LVSF AND DF/ EF 55-60%/ SEVERE CALCIFIC AORTIC STENOSIS W/ MODERATE - SEVERE  AORTIC INSUFFICIENCY/ MILD MITRAL SCLEROSIS/ NORMAL PULMONARY ARTERY PRESSURE / NO SIGNIFICANT CHANGE SINCE LAST ECHO 06-14-2006    Social History  Substance Use Topics  . Smoking status: Former Smoker    Years: 20.00    Types: Cigarettes    Quit date: 08/16/2011  . Smokeless tobacco: Never Used  . Alcohol use Yes     Comment: weekends    Family History  Problem Relation Age of Onset  . Heart disease Mother   . Stroke Mother   . Diabetes Mother   . Heart disease Father   . Stroke Father   . Hypertension Father   . Diabetes Father     No Known Allergies  Medication list has been reviewed and updated.  Current Outpatient Prescriptions on File Prior to Visit  Medication Sig Dispense Refill  . acyclovir (ZOVIRAX) 200 MG capsule Take 200 mg by mouth daily as needed. For fever blisters    . aspirin 81 MG tablet Take 81 mg by mouth daily.    Marland Kitchen FLUoxetine (PROZAC) 20 MG tablet Take 1 tablet (20  mg total) by mouth daily. 30 tablet 6   No current facility-administered medications on file prior to visit.     Review of Systems:  As per HPI- otherwise negative. No CP, SOB, loss of appetite, nausea, vomiting or diarrhea   Physical Examination: Blood pressure 110/70, pulse 79, temperature 98.7 F (37.1 C), temperature source Oral, height 5\' 6"  (1.676 m), weight 166 lb 3.2 oz (75.4 kg), last menstrual period 01/24/2016, SpO2 99 %.  GEN: WDWN, NAD, Non-toxic, A & O x 3 HEENT: Atraumatic, Normocephalic. Neck supple. No masses, No LAD. Ears and Nose: No external deformity. CV: RRR, loud harsh systolic murmur from her aortic valve replacement. No JVD. No thrill. No extra heart sounds. PULM: CTA B, no wheezes, crackles, rhonchi. No retractions. No resp. distress. No  accessory muscle use. EXTR: No c/c/e NEURO Normal gait.  PSYCH: Normally interactive. Conversant. Not depressed or anxious appearing.  Calm demeanor.    Assessment and Plan: GAD (generalized anxiety disorder) - Plan: FLUoxetine (PROZAC) 40 MG capsule  incompletely controlled sx of GAD- she is improved but not at her goal.  Will increase her prozac to 40 mg She will let me know if any problems with SE or worsening Plan to recheck here in 3 months   Signed Lamar Blinks, MD

## 2016-06-12 ENCOUNTER — Ambulatory Visit (INDEPENDENT_AMBULATORY_CARE_PROVIDER_SITE_OTHER): Payer: 59 | Admitting: Family Medicine

## 2016-06-12 ENCOUNTER — Encounter: Payer: Self-pay | Admitting: Family Medicine

## 2016-06-12 VITALS — BP 120/82 | HR 59 | Temp 97.9°F | Ht 66.0 in | Wt 166.8 lb

## 2016-06-12 DIAGNOSIS — Z23 Encounter for immunization: Secondary | ICD-10-CM | POA: Diagnosis not present

## 2016-06-12 DIAGNOSIS — F411 Generalized anxiety disorder: Secondary | ICD-10-CM | POA: Diagnosis not present

## 2016-06-12 NOTE — Progress Notes (Signed)
Follow up visit . Patient had discontinued her Prozac 40 mg.

## 2016-06-12 NOTE — Patient Instructions (Addendum)
It was great to see you today- I am glad that you are doing well!  Let me know if you have any other concerns, and we can plan to meet at the end of the year and do your annual labs  You got your "tdap" tetanus and whooping cough booster today  Take care!

## 2016-06-12 NOTE — Progress Notes (Addendum)
[place] at [facility] [address], [place], [place] [address] [phone]  Date:  06/12/2016   Name:  Shelby Eaton   DOB:  [birth year]   MRN:  [id number]  PCP:  Darreld Mclean, MD    Chief Complaint: Follow-up (Off Prozac)   History of Present Illness:  Shelby Eaton is a 46 y.o. very pleasant female patient who presents with the following:  Here today for a follow-up visit- last seen by myserlf in January for her anxiety. We had increased her prozac at that time from 20 -40 mg.   She was able to come off the prozac over Easter. She "is still an anxiety prone person" but she feels that she is doing ok She is not having any issues with depression  She does go to [facility] for her pap- this will be due later on this summer  She would like to have a tetanus booster today.   Her last tetanus was over 10 years ago, but not sure of exact date.  She would like tdap as she is around small children   Patient Active Problem List   Diagnosis Date Noted  . Anxiety 12/17/2013  . Feeling of chest tightness 04/28/2013  . S/P aortic valve replacement with bioprosthetic valve 04/08/2012  . IVCD (intraventricular conduction defect) 04/08/2012  . Aortic stenosis 10/24/2011    Past Medical History:  Diagnosis Date  . Aortic valve insufficiency, congenital   . Endometrial polyp   . Murmur   . Severe aortic valve stenosis CONGENITAL --  CARDIOLOGIST -- DR BRACKBILL-- LOV IN EPIC   . Vaginal cyst     Past Surgical History:  Procedure Laterality Date  . TRANSTHORACIC ECHOCARDIOGRAM  12-18-2007  DR BRACKBILL   NORMAL LVSF AND DF/ EF 55-60%/ SEVERE CALCIFIC AORTIC STENOSIS W/ MODERATE - SEVERE  AORTIC INSUFFICIENCY/ MILD MITRAL SCLEROSIS/ NORMAL PULMONARY ARTERY PRESSURE / NO SIGNIFICANT CHANGE SINCE LAST ECHO 06-14-2006    Social History  Substance Use Topics  . Smoking status: Former Smoker    Years: 20.00    Types: Cigarettes     Quit date: 08/16/2011  . Smokeless tobacco: Never Used  . Alcohol use Yes     Comment: weekends    Family History  Problem Relation Age of Onset  . Heart disease Mother   . Stroke Mother   . Diabetes Mother   . Heart disease Father   . Stroke Father   . Hypertension Father   . Diabetes Father     No Known Allergies  Medication list has been reviewed and updated.  Current Outpatient Prescriptions on File Prior to Visit  Medication Sig Dispense Refill  . acyclovir (ZOVIRAX) 200 MG capsule Take 200 mg by mouth daily as needed. For fever blisters    . aspirin 81 MG tablet Take 81 mg by mouth daily.    Marland Kitchen FLUoxetine (PROZAC) 40 MG capsule Take 1 capsule (40 mg total) by mouth daily. (Patient not taking: Reported on 06/12/2016) 90 capsule 3   No current facility-administered medications on file prior to visit.     Review of Systems:  As per HPI- otherwise negative.   Physical Examination: Vitals:   06/12/16 1532  BP: 120/82  Pulse: (!) 59  Temp: 97.9 F (36.6 C)   Vitals:   06/12/16 1532  Weight: 166 lb 12.8 oz (75.7 kg)  Height: 5\' 6"  (1.676 m)   Body mass index is  26.92 kg/m. Ideal Body Weight: Weight in (lb) to have BMI = 25: 154.6  GEN: WDWN, NAD, Non-toxic, A & O x 3, normal weight, looks well HEENT: Atraumatic, Normocephalic. Neck supple. No masses, No LAD. Ears and Nose: No external deformity. CV: RRR, prosthetic valve, noG/R. No JVD. No thrill. No extra heart sounds. PULM: CTA B, no wheezes, crackles, rhonchi. No retractions. No resp. distress. No accessory muscle use. EXTR: No c/c/e NEURO Normal gait.  PSYCH: Normally interactive. Conversant. Not depressed or anxious appearing.  Calm demeanor.    Assessment and Plan: Immunization due - Plan: Tdap vaccine greater than or equal to 7yo IM  GAD (generalized anxiety disorder)  Here today to follow-up on her anxiety. She has come off her prozac and feels that she is doing fine.  Her anxiety is still  present but not overly bothersome and she does not feel that she needs any medication at this time  tdap today Follow-up this fall   Signed Lamar Blinks, MD

## 2016-09-13 DIAGNOSIS — Z01419 Encounter for gynecological examination (general) (routine) without abnormal findings: Secondary | ICD-10-CM | POA: Diagnosis not present

## 2016-09-18 ENCOUNTER — Other Ambulatory Visit: Payer: Self-pay | Admitting: Obstetrics and Gynecology

## 2016-09-18 DIAGNOSIS — R921 Mammographic calcification found on diagnostic imaging of breast: Secondary | ICD-10-CM

## 2016-09-27 ENCOUNTER — Ambulatory Visit
Admission: RE | Admit: 2016-09-27 | Discharge: 2016-09-27 | Disposition: A | Discharge status: Home / Self Care | Payer: 59 | Source: Ambulatory Visit | Attending: Obstetrics and Gynecology | Admitting: Obstetrics and Gynecology

## 2016-09-27 ENCOUNTER — Other Ambulatory Visit: Payer: Self-pay | Admitting: Obstetrics and Gynecology

## 2016-09-27 DIAGNOSIS — R921 Mammographic calcification found on diagnostic imaging of breast: Secondary | ICD-10-CM | POA: Diagnosis not present

## 2016-09-27 DIAGNOSIS — N6489 Other specified disorders of breast: Secondary | ICD-10-CM | POA: Diagnosis not present

## 2016-09-27 DIAGNOSIS — N6310 Unspecified lump in the right breast, unspecified quadrant: Secondary | ICD-10-CM

## 2016-09-27 DIAGNOSIS — N63 Unspecified lump in unspecified breast: Secondary | ICD-10-CM

## 2016-11-10 DIAGNOSIS — Z23 Encounter for immunization: Secondary | ICD-10-CM | POA: Diagnosis not present

## 2017-03-28 ENCOUNTER — Other Ambulatory Visit: Payer: Self-pay | Admitting: Obstetrics and Gynecology

## 2017-03-28 ENCOUNTER — Ambulatory Visit
Admission: RE | Admit: 2017-03-28 | Discharge: 2017-03-28 | Disposition: A | Discharge status: Home / Self Care | Payer: 59 | Source: Ambulatory Visit | Attending: Obstetrics and Gynecology | Admitting: Obstetrics and Gynecology

## 2017-03-28 DIAGNOSIS — N6320 Unspecified lump in the left breast, unspecified quadrant: Secondary | ICD-10-CM

## 2017-03-28 DIAGNOSIS — N6489 Other specified disorders of breast: Secondary | ICD-10-CM | POA: Diagnosis not present

## 2017-03-28 DIAGNOSIS — N63 Unspecified lump in unspecified breast: Secondary | ICD-10-CM

## 2017-03-28 DIAGNOSIS — R921 Mammographic calcification found on diagnostic imaging of breast: Secondary | ICD-10-CM

## 2017-03-28 DIAGNOSIS — N6310 Unspecified lump in the right breast, unspecified quadrant: Secondary | ICD-10-CM

## 2017-07-30 ENCOUNTER — Encounter: Payer: Self-pay | Admitting: Cardiovascular Disease

## 2017-08-15 ENCOUNTER — Ambulatory Visit: Payer: 59 | Admitting: Cardiovascular Disease

## 2017-09-11 NOTE — Progress Notes (Signed)
Cardiology Office Note   Date:  09/13/2017   ID:  Shelby Eaton, DOB 02-Mar-1970, MRN [id number]  PCP:  Darreld Mclean, MD  Cardiologist: Darlin Coco MD  No chief complaint on file.     History of Present Illness: 47 y.o. not seen in last 2 years. F/U for AV disease  She had a past history of bicuspid aortic valve with severe aortic stenosis. On 02/28/12 she underwent aortic valve replacement at [facility] by [place]. She did well. She has a bovine bioprosthetic valve. She was also told that her other 3 valves also have slight leaks. She did not have any postoperative complications or arrhythmias. She has had some family stress with mom dying a few years ago from afib and stroke complications She is single and has sister and father in area Works for [place] echo here was in 2017 and had continued elevated gradients with mean 27 mmHg peak 52 mmHg and DVI .54   She works a lot and is single Does not have a lot of hobbies More sedentary Discussed issues with life span of a tissue valve   Going to dentist with [facility] twice/year      Past Medical History:  Diagnosis Date  . Aortic valve insufficiency, congenital   . Endometrial polyp   . Murmur   . Severe aortic valve stenosis CONGENITAL --  CARDIOLOGIST -- DR BRACKBILL-- LOV IN EPIC   . Vaginal cyst     Past Surgical History:  Procedure Laterality Date  . TRANSTHORACIC ECHOCARDIOGRAM  12-18-2007  DR BRACKBILL   NORMAL LVSF AND DF/ EF 55-60%/ SEVERE CALCIFIC AORTIC STENOSIS W/ MODERATE - SEVERE  AORTIC INSUFFICIENCY/ MILD MITRAL SCLEROSIS/ NORMAL PULMONARY ARTERY PRESSURE / NO SIGNIFICANT CHANGE SINCE LAST ECHO 06-14-2006     Current Outpatient Medications  Medication Sig Dispense Refill  . acyclovir (ZOVIRAX) 200 MG capsule Take 200 mg by mouth daily as needed. For fever blisters    . aspirin 81 MG tablet Take 81 mg by mouth daily.     No current facility-administered  medications for this visit.     Allergies:   Patient has no known allergies.    Social History:  The patient  reports that she quit smoking about 6 years ago. Her smoking use included cigarettes. She quit after 20.00 years of use. She has never used smokeless tobacco. She reports that she drinks alcohol. She reports that she does not use drugs.   Family History:  The patient's family history includes Diabetes in her father and mother; Heart disease in her father and mother; Hypertension in her father; Stroke in her father and mother.  Patient's mother is living with diabetes.  Father has a history of diabetes and TIAs.  ROS:  Please see the history of present illness.   Otherwise, review of systems are positive for none.   All other systems are reviewed and negative.    PHYSICAL EXAM: VS:  BP 102/78   Pulse 68   Ht 5\' 6"  (1.676 m)   Wt 164 lb 9.6 oz (74.7 kg)   SpO2 99%   BMI 26.57 kg/m  , BMI Body mass index is 26.57 kg/m. GEN: Well nourished, well developed, in no acute distress  HEENT: normal  Neck: no JVD, carotid bruits, or masses Cardiac: Rhythm is regular.  There is grade 2/6 harsh systolic ejection murmur at the base.  No diastolic murmur. Respiratory:  clear to auscultation bilaterally, normal work  of breathing GI: soft, nontender, nondistended, + BS MS: no deformity or atrophy  Skin: warm and dry, no rash Neuro:  Strength and sensation are intact Psych: euthymic mood, full affect   EKG:   09/13/17 SR rate 68 LAE IVCD   Recent Labs: No results found for requested labs within last 8760 hours.    Lipid Panel    Component Value Date/Time   CHOL 171 11/03/2015 1650   TRIG 118.0 11/03/2015 1650   HDL 59.10 11/03/2015 1650   CHOLHDL 3 11/03/2015 1650   VLDL 23.6 11/03/2015 1650   LDLCALC 88 11/03/2015 1650      Wt Readings from Last 3 Encounters:  09/13/17 164 lb 9.6 oz (74.7 kg)  06/12/16 166 lb 12.8 oz (75.7 kg)  02/07/16 166 lb 3.2 oz (75.4 kg)       ASSESSMENT AND PLAN:  1. Status post prosthetic aortic valve replacement for bicuspid aortic valve on 02/28/12 at [facility] by Dr. Evelina Dun.Last echo done here reviewed 08/11/15 with EF 60-65% no change in gradients since 2015 but elevated with mean 27 mmHg and peak 52 mmHg and DVI of .31 will update echo in a year as she is asymptomatic   .   Continue current medication. Return in 6 months for office visit .   Current medicines are reviewed at length with the patient today.  The patient does not have concerns regarding medicines.  The following changes have been made:  no change  [place]

## 2017-09-13 ENCOUNTER — Encounter

## 2017-09-13 ENCOUNTER — Encounter: Payer: Self-pay | Admitting: Cardiovascular Disease

## 2017-09-13 ENCOUNTER — Ambulatory Visit (INDEPENDENT_AMBULATORY_CARE_PROVIDER_SITE_OTHER): Payer: 59 | Admitting: Cardiovascular Disease

## 2017-09-13 VITALS — BP 102/78 | HR 68 | Ht 66.0 in | Wt 164.6 lb

## 2017-09-13 DIAGNOSIS — Z952 Presence of prosthetic heart valve: Secondary | ICD-10-CM

## 2017-09-13 NOTE — Patient Instructions (Signed)

## 2017-10-01 ENCOUNTER — Ambulatory Visit
Admission: RE | Admit: 2017-10-01 | Discharge: 2017-10-01 | Disposition: A | Discharge status: Home / Self Care | Payer: 59 | Source: Ambulatory Visit | Attending: Obstetrics and Gynecology | Admitting: Obstetrics and Gynecology

## 2017-10-01 DIAGNOSIS — N6320 Unspecified lump in the left breast, unspecified quadrant: Secondary | ICD-10-CM | POA: Diagnosis not present

## 2017-10-01 DIAGNOSIS — N6310 Unspecified lump in the right breast, unspecified quadrant: Secondary | ICD-10-CM | POA: Diagnosis not present

## 2017-10-01 DIAGNOSIS — R922 Inconclusive mammogram: Secondary | ICD-10-CM | POA: Diagnosis not present

## 2017-10-01 DIAGNOSIS — R921 Mammographic calcification found on diagnostic imaging of breast: Secondary | ICD-10-CM

## 2017-10-11 DIAGNOSIS — Z01419 Encounter for gynecological examination (general) (routine) without abnormal findings: Secondary | ICD-10-CM | POA: Diagnosis not present

## 2017-10-11 DIAGNOSIS — N920 Excessive and frequent menstruation with regular cycle: Secondary | ICD-10-CM | POA: Diagnosis not present

## 2017-10-11 DIAGNOSIS — Z124 Encounter for screening for malignant neoplasm of cervix: Secondary | ICD-10-CM | POA: Diagnosis not present

## 2017-10-11 DIAGNOSIS — N951 Menopausal and female climacteric states: Secondary | ICD-10-CM | POA: Diagnosis not present

## 2017-10-11 LAB — HM PAP SMEAR

## 2017-10-11 LAB — RESULTS CONSOLE HPV: CHL HPV: NEGATIVE

## 2017-11-05 DIAGNOSIS — N951 Menopausal and female climacteric states: Secondary | ICD-10-CM | POA: Diagnosis not present

## 2017-11-07 DIAGNOSIS — N920 Excessive and frequent menstruation with regular cycle: Secondary | ICD-10-CM | POA: Diagnosis not present

## 2017-11-09 DIAGNOSIS — Z23 Encounter for immunization: Secondary | ICD-10-CM | POA: Diagnosis not present

## 2017-12-05 DIAGNOSIS — Z3202 Encounter for pregnancy test, result negative: Secondary | ICD-10-CM | POA: Diagnosis not present

## 2017-12-10 ENCOUNTER — Encounter: Payer: Self-pay | Admitting: Family Medicine

## 2018-01-24 DIAGNOSIS — Z30431 Encounter for routine checking of intrauterine contraceptive device: Secondary | ICD-10-CM | POA: Diagnosis not present

## 2018-10-08 ENCOUNTER — Other Ambulatory Visit: Payer: Self-pay | Admitting: Obstetrics and Gynecology

## 2018-10-08 DIAGNOSIS — N63 Unspecified lump in unspecified breast: Secondary | ICD-10-CM

## 2018-10-15 ENCOUNTER — Other Ambulatory Visit: Payer: Self-pay

## 2018-10-15 ENCOUNTER — Ambulatory Visit
Admission: RE | Admit: 2018-10-15 | Discharge: 2018-10-15 | Disposition: A | Discharge status: Home / Self Care | Payer: 59 | Source: Ambulatory Visit | Attending: Obstetrics and Gynecology | Admitting: Obstetrics and Gynecology

## 2018-10-15 DIAGNOSIS — N63 Unspecified lump in unspecified breast: Secondary | ICD-10-CM

## 2018-10-23 ENCOUNTER — Telehealth: Payer: Self-pay

## 2018-10-23 DIAGNOSIS — Z952 Presence of prosthetic heart valve: Secondary | ICD-10-CM

## 2018-10-23 NOTE — Telephone Encounter (Signed)
Will send message to scheduling to see if they can call patient with an appointment. Order has been placed.

## 2018-10-23 NOTE — Telephone Encounter (Signed)
-----   Message from Josue Hector, MD sent at 10/23/2018  8:21 AM EDT ----- Would be nice for her to have echo tomorrow as well for AVR

## 2018-10-23 NOTE — Progress Notes (Signed)
Cardiology Office Note   Date:  10/25/2018   ID:  Shelby Eaton, DOB [birth year], MRN [id number]  PCP:  Darreld Mclean, MD  Cardiologist: Darlin Coco MD  No chief complaint on file.     History of Present Illness: 48 y.o. not seen in last 2 years. F/U for AV disease  She had a past history of bicuspid aortic valve with severe aortic stenosis. On 02/28/12 she underwent aortic valve replacement at [facility] by [place]. She did well. She has a bovine bioprosthetic valve. She was also told that her other 3 valves also have slight leaks. She did not have any postoperative complications or arrhythmias. She has had some family stress with mom dying a few years ago from afib and stroke complications She is single and has sister and father in area Works for [place] echo here was in 2017 and had continued elevated gradients with mean 27 mmHg peak 52 mmHg and DVI .33   She works a lot and is single Does not have a lot of hobbies More sedentary Discussed issues with life span of a tissue valve   Going to dentist with [facility] twice/year  Working from home    Past Medical History:  Diagnosis Date  . Aortic valve insufficiency, congenital   . Endometrial polyp   . Murmur   . Severe aortic valve stenosis CONGENITAL --  CARDIOLOGIST -- DR BRACKBILL-- LOV IN EPIC   . Vaginal cyst     Past Surgical History:  Procedure Laterality Date  . TRANSTHORACIC ECHOCARDIOGRAM  12-18-2007  DR BRACKBILL   NORMAL LVSF AND DF/ EF 55-60%/ SEVERE CALCIFIC AORTIC STENOSIS W/ MODERATE - SEVERE  AORTIC INSUFFICIENCY/ MILD MITRAL SCLEROSIS/ NORMAL PULMONARY ARTERY PRESSURE / NO SIGNIFICANT CHANGE SINCE LAST ECHO 06-14-2006     Current Outpatient Medications  Medication Sig Dispense Refill  . acyclovir (ZOVIRAX) 200 MG capsule Take 200 mg by mouth daily as needed. For fever blisters    . aspirin 81 MG tablet Take 81 mg by mouth daily.     No current  facility-administered medications for this visit.     Allergies:   Patient has no known allergies.    Social History:  The patient  reports that she quit smoking about 7 years ago. Her smoking use included cigarettes. She quit after 20.00 years of use. She has never used smokeless tobacco. She reports current alcohol use. She reports that she does not use drugs.   Family History:  The patient's family history includes Diabetes in her father and mother; Heart disease in her father and mother; Hypertension in her father; Stroke in her father and mother.  Patient's mother is living with diabetes.  Father has a history of diabetes and TIAs.  ROS:  Please see the history of present illness.   Otherwise, review of systems are positive for none.   All other systems are reviewed and negative.    PHYSICAL EXAM: VS:  BP 116/70 (BP Location: Right Arm, Patient Position: Sitting, Cuff Size: Normal)   Ht 5\' 6"  (1.676 m)   Wt 176 lb (79.8 kg)   BMI 28.41 kg/m  , BMI Body mass index is 28.41 kg/m. GEN: Well nourished, well developed, in no acute distress  HEENT: normal  Neck: no JVD, carotid bruits, or masses Cardiac: Rhythm is regular.  There is grade 2/6 harsh systolic ejection murmur at the base.  No diastolic murmur. Respiratory:  clear to auscultation bilaterally, normal  work of breathing GI: soft, nontender, nondistended, + BS MS: no deformity or atrophy  Skin: warm and dry, no rash Neuro:  Strength and sensation are intact Psych: euthymic mood, full affect   EKG:   09/13/17 SR rate 68 LAE IVCD   Recent Labs: No results found for requested labs within last 8760 hours.    Lipid Panel    Component Value Date/Time   CHOL 171 11/03/2015 1650   TRIG 118.0 11/03/2015 1650   HDL 59.10 11/03/2015 1650   CHOLHDL 3 11/03/2015 1650   VLDL 23.6 11/03/2015 1650   LDLCALC 88 11/03/2015 1650      Wt Readings from Last 3 Encounters:  10/25/18 176 lb (79.8 kg)  09/13/17 164 lb 9.6 oz (74.7  kg)  06/12/16 166 lb 12.8 oz (75.7 kg)      ASSESSMENT AND PLAN:  1. Status post prosthetic aortic valve replacement for bicuspid aortic valve on 02/28/12 at [facility] by Dr. Evelina Dun.Last echo done here reviewed 08/11/15 with EF 60-65% no change in gradients since 2015 but elevated with mean 27 mmHg and peak 52 mmHg and DVI of .31 will update echo   .   Continue current medication. Return in a year if echo ok    Current medicines are reviewed at length with the patient today.  The patient does not have concerns regarding medicines.  The following changes have been made:  no change  [place]

## 2018-10-25 ENCOUNTER — Other Ambulatory Visit: Payer: Self-pay

## 2018-10-25 ENCOUNTER — Ambulatory Visit (INDEPENDENT_AMBULATORY_CARE_PROVIDER_SITE_OTHER): Payer: 59 | Admitting: Cardiovascular Disease

## 2018-10-25 VITALS — BP 116/70 | HR 60 | Ht 66.0 in | Wt 176.0 lb

## 2018-10-25 DIAGNOSIS — Z952 Presence of prosthetic heart valve: Secondary | ICD-10-CM

## 2018-10-25 NOTE — Patient Instructions (Addendum)

## 2018-11-01 ENCOUNTER — Ambulatory Visit (HOSPITAL_COMMUNITY): Payer: 59 | Attending: Cardiology

## 2018-11-01 ENCOUNTER — Other Ambulatory Visit: Payer: Self-pay

## 2018-11-01 DIAGNOSIS — Z952 Presence of prosthetic heart valve: Secondary | ICD-10-CM | POA: Insufficient documentation

## 2019-04-02 IMAGING — MG DIGITAL DIAGNOSTIC BILATERAL MAMMOGRAM WITH TOMO AND CAD
8 of 12 series · 8 of 32 positions shown · non-contrast
Comparison: Previous exam(s).

CLINICAL DATA: 47-year-old female presenting for annual bilateral
mammogram and final 2 year follow-up of probably benign right breast
calcifications and 1 year follow-up of probably benign bilateral
breast masses.

EXAM:
DIGITAL DIAGNOSTIC BILATERAL MAMMOGRAM WITH CAD AND TOMO
ULTRASOUND BILATERAL BREAST

[R CC]
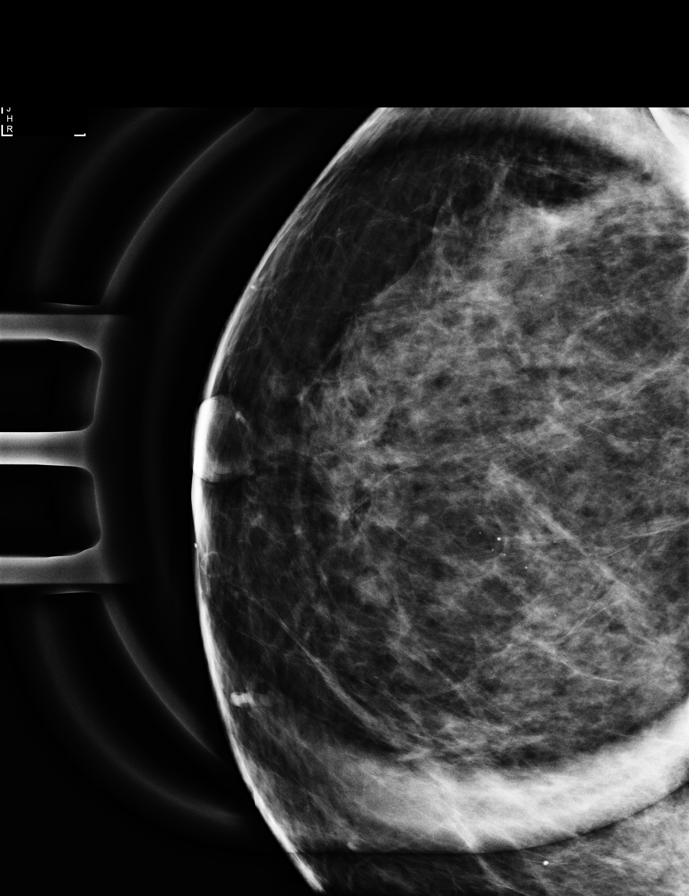

[R ML]
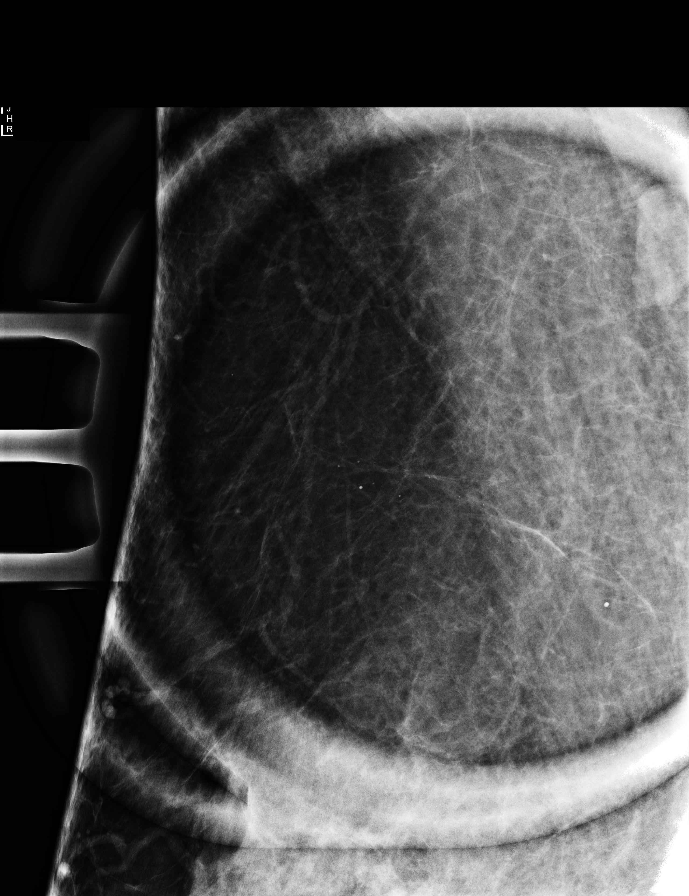

[R CC synth-2D (1 of 2)]
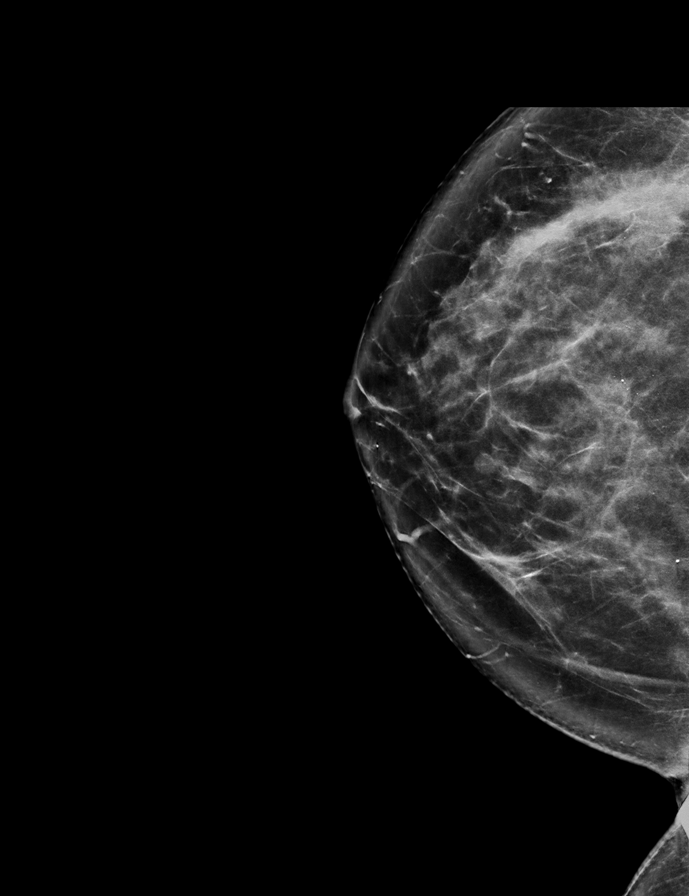

[R CC synth-2D (2 of 2)]
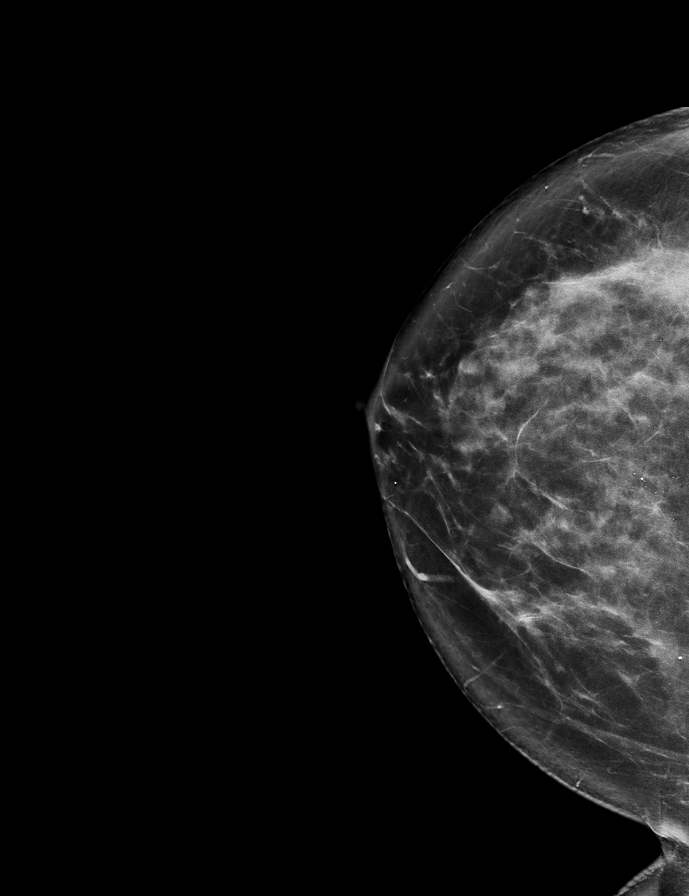

[L MLO synth-2D]
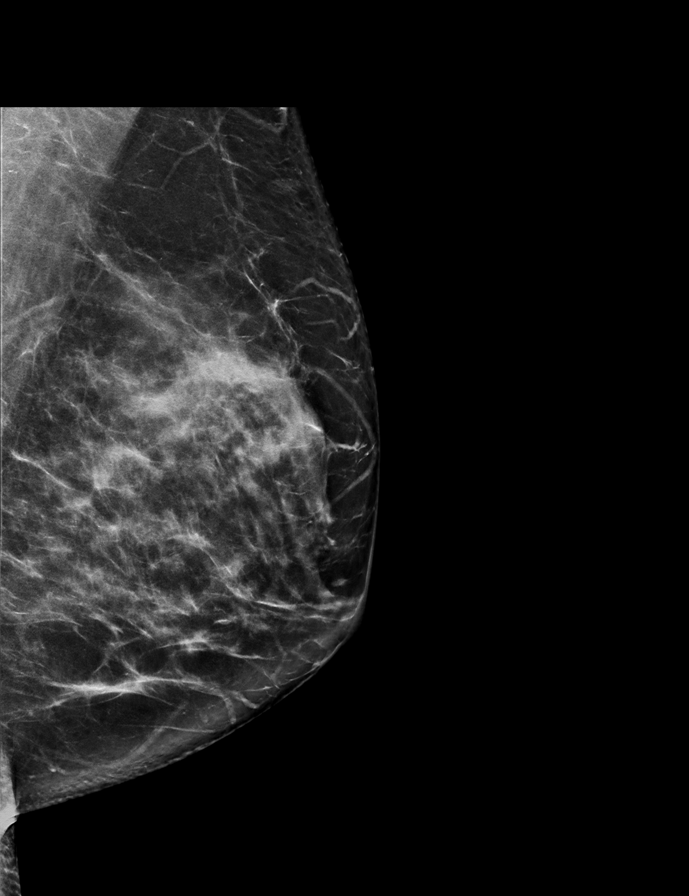

[R MLO synth-2D]
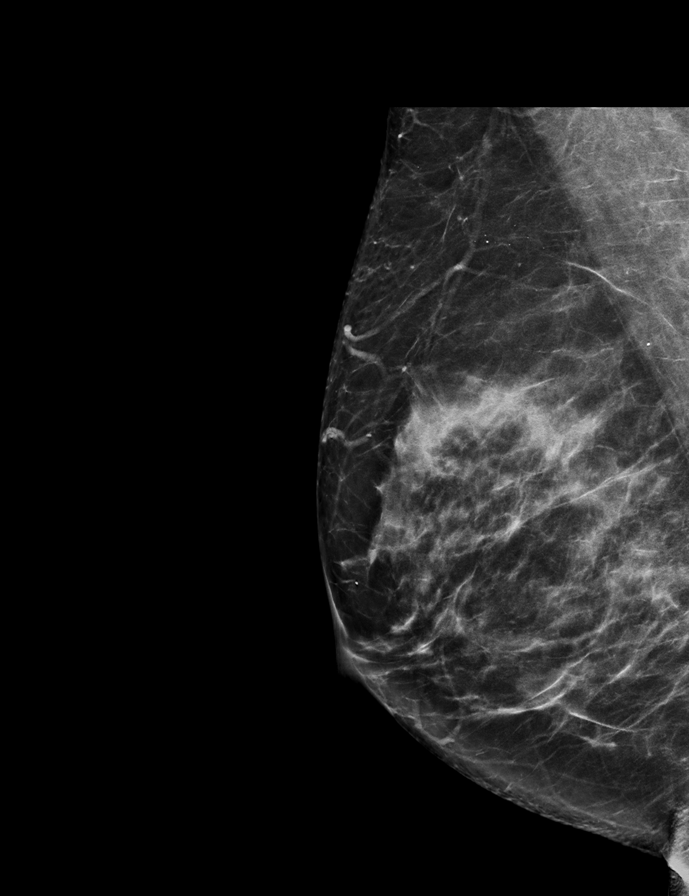

[L CC synth-2D]
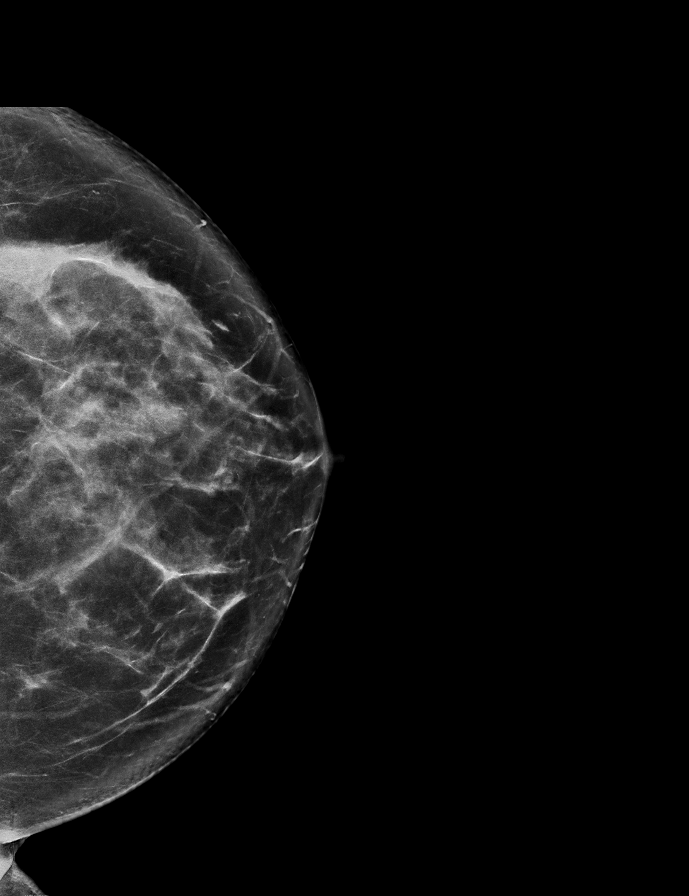

[R CC tomo · tomo slice 41/82.0]
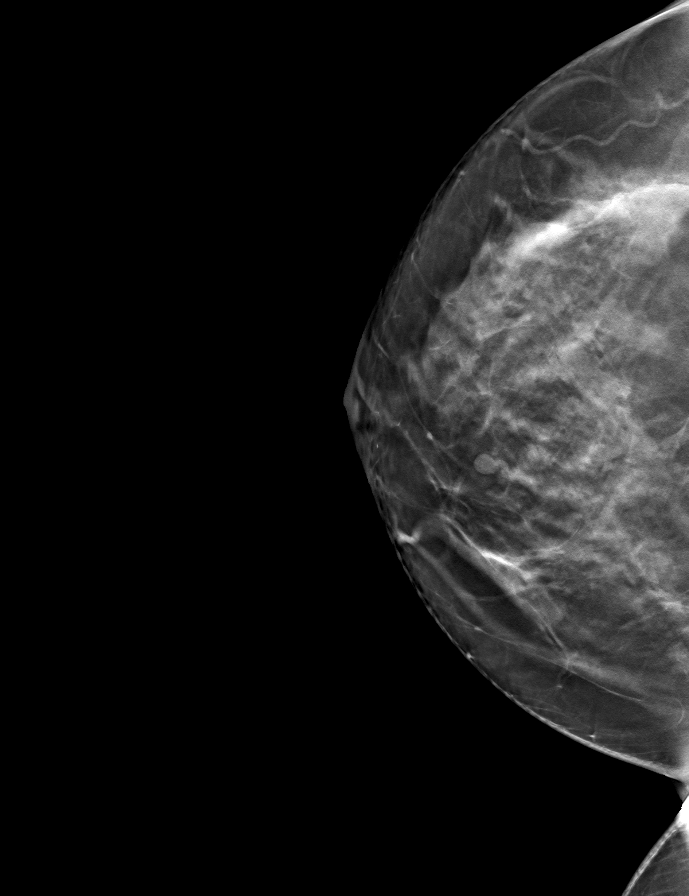

[8 of 32 positions shown; findings below may reference images not displayed]

ACR Breast Density Category c: The breast tissue is heterogeneously
dense, which may obscure small masses.
FINDINGS: Loosely grouped, punctate calcifications in the superior central
right breast are mammographically stable dating back to 8290.

Additional circumscribed masses scattered throughout the bilateral
breasts are also stable. No new or suspicious findings are
identified.

Mammographic images were processed with CAD.

Targeted ultrasound is performed, showing stable appearance of
multiple bilateral breast masses. These include;

A 0.6 x 0.4 x 0.4 cm mass at the 1 o'clock position 3 cm from the
nipple on the right (previously 0.7 x 0.4 x 0.4 cm).

A 1.5 x 1.1 x 0.8 cm mass at the 1 o'clock position 3 cm from the
nipple on the left (previously 1.5 x 1.1 x 0.7 cm).

A 0.7 x 0.6 x 0.4 cm mass at the 1 o'clock position 3 cm from the
nipple on the left (previously 0.7 x 0.6 x 0.3 cm).

A 0.8 x 0.8 x 0.5 cm mass at the [DATE] position 3 cm from the nipple
on the left (previously 0.8 x 0.8 x 0 5 cm).

A 0.5 x 0.3 x 0.2 cm mass at the [DATE] position 3 cm from the nipple
on the left (previously 0.5 x 0.4 X 0.2 cm).
IMPRESSION: 1. Benign right breast calcifications demonstrating greater than 2
year stability. No further imaging follow-up required.
2. Probably benign bilateral breast masses demonstrating 1 year
stability. Recommendation is for a final ultrasound follow-up in 1
year to coincide with the patient's annual bilateral mammogram.

RECOMMENDATION:
Bilateral diagnostic mammogram and bilateral ultrasound in 1 year.

I have discussed the findings and recommendations with the patient.
Results were also provided in writing at the conclusion of the
visit. If applicable, a reminder letter will be sent to the patient
regarding the next appointment.

BI-RADS CATEGORY  3: Probably benign.

## 2019-11-10 NOTE — Progress Notes (Signed)
Cardiology Office Note   Date:  11/11/2019   ID:  Shelby Eaton, DOB 1970/09/06, MRN [id number]  PCP:  Darreld Mclean, MD  Cardiologist: Darlin Coco MD  No chief complaint on file.     History of Present Illness: 49 y.o. not seen in last 2 years. F/U for AV disease  She had a past history of bicuspid aortic valve with severe aortic stenosis. On 02/28/12 she underwent aortic valve replacement at [facility] by [place]. She did well. She has a bovine bioprosthetic valve. She was also told that her other 3 valves also have slight leaks. She did not have any postoperative complications or arrhythmias. She has had some family stress with mom dying a few years ago from afib and stroke complications She is single and has sister and father in area Works for [employer] echo here was in 2017 and had continued elevated gradients with mean 27 mmHg peak 52 mmHg and DVI .36   She works a lot and is single Does not have a lot of hobbies More sedentary Discussed issues with life span of a tissue valve   Going to dentist with SBE twice/year  Working from home   Has had COVID vaccine Echo done 11/01/18 showed mean gradient decreased from 10mmHg to 20 mmHg no PVL and DVI 0.32  She has been having SSCP and epigastric pain for 2 weeks. Not improved with Rx for GERD. Pain intermittent Not always exertional does radiate to back. No associated nausea, vomiting, fever or change in bowel habits Mild dyspnea pain rarely pleuritic    Past Medical History:  Diagnosis Date  . Aortic valve insufficiency, congenital   . Endometrial polyp   . Murmur   . Severe aortic valve stenosis CONGENITAL --  CARDIOLOGIST -- DR BRACKBILL-- LOV IN EPIC   . Vaginal cyst     Past Surgical History:  Procedure Laterality Date  . TRANSTHORACIC ECHOCARDIOGRAM  12-18-2007  DR BRACKBILL   NORMAL LVSF AND DF/ EF 55-60%/ SEVERE CALCIFIC AORTIC STENOSIS W/ MODERATE - SEVERE  AORTIC  INSUFFICIENCY/ MILD MITRAL SCLEROSIS/ NORMAL PULMONARY ARTERY PRESSURE / NO SIGNIFICANT CHANGE SINCE LAST ECHO 06-14-2006     Current Outpatient Medications  Medication Sig Dispense Refill  . aspirin 81 MG tablet Take 81 mg by mouth daily.    Marland Kitchen levonorgestrel (MIRENA, 52 MG,) 20 MCG/24HR IUD Mirena 20 mcg/24 hours (5 yrs) 52 mg intrauterine device  Take 1 device every day by intrauterine route.    . valACYclovir (VALTREX) 1000 MG tablet Take 1 tablet by mouth as needed.    . metoprolol tartrate (LOPRESSOR) 100 MG tablet Take one tablet by mouth 2 hours prior to CT. 1 tablet 0   No current facility-administered medications for this visit.    Allergies:   Patient has no known allergies.    Social History:  The patient  reports that she quit smoking about 8 years ago. Her smoking use included cigarettes. She quit after 20.00 years of use. She has never used smokeless tobacco. She reports current alcohol use. She reports that she does not use drugs.   Family History:  The patient's family history includes Diabetes in her father and mother; Heart disease in her father and mother; Hypertension in her father; Stroke in her father and mother.  Patient's mother is living with diabetes.  Father has a history of diabetes and TIAs.  ROS:  Please see the history of present illness.  Otherwise, review of systems are positive for none.   All other systems are reviewed and negative.    PHYSICAL EXAM: VS:  BP 130/74   Pulse (!) 57   Ht 5\' 6"  (1.676 m)   Wt 184 lb (83.5 kg)   SpO2 99%   BMI 29.70 kg/m  , BMI Body mass index is 29.7 kg/m. GEN: Well nourished, well developed, in no acute distress  HEENT: normal  Neck: no JVD, carotid bruits, or masses Cardiac: Rhythm is regular.  There is grade 2/6 harsh systolic ejection murmur at the base.  No diastolic murmur. Respiratory:  clear to auscultation bilaterally, normal work of breathing GI: epigastric pain to palpation  MS: no deformity or  atrophy  Skin: warm and dry, no rash Neuro:  Strength and sensation are intact Psych: euthymic mood, full affect   EKG:   09/13/17 SR rate 68 LAE IVCD   Recent Labs: No results found for requested labs within last 8760 hours.    Lipid Panel    Component Value Date/Time   CHOL 171 11/03/2015 1650   TRIG 118.0 11/03/2015 1650   HDL 59.10 11/03/2015 1650   CHOLHDL 3 11/03/2015 1650   VLDL 23.6 11/03/2015 1650   LDLCALC 88 11/03/2015 1650      Wt Readings from Last 3 Encounters:  11/11/19 184 lb (83.5 kg)  10/25/18 176 lb (79.8 kg)  09/13/17 164 lb 9.6 oz (74.7 kg)      ASSESSMENT AND PLAN:  1. Status post prosthetic aortic valve replacement for bicuspid aortic valve on 02/28/12 at [facility] by Dr. Evelina Dun. Echo done here reviewed 08/11/15 with EF 60-65% no change in gradients since 2015 but elevated with mean 27 mmHg and peak 52 mmHg and DVI of .31 Last echo 11/01/18 improved with mean gradient 20 mmHg peak 37 mmHg DVI 0.32 And no PVL continue SBE prophylaxis  She is having SSCP radiating to back Concern for new aortic pathology Prefer to do gated cardiac CTA so I can see valve function , entire aorta and and coronary arteries Her exam is remarkable for epigastric pain to palpation will check lipase, amylase, LFTls BMET for CTA and CBC.   She needs to f/u with Dr Edilia Bo as this may end up being more GB/PUD  .  Current medicines are reviewed at length with the patient today.  The patient does not have concerns regarding medicines.  The following changes have been made:  no change  Jenkins Rouge

## 2019-11-11 ENCOUNTER — Ambulatory Visit (INDEPENDENT_AMBULATORY_CARE_PROVIDER_SITE_OTHER): Payer: BC Managed Care – PPO | Admitting: Cardiovascular Disease

## 2019-11-11 ENCOUNTER — Telehealth (HOSPITAL_COMMUNITY): Payer: Self-pay | Admitting: Emergency Medicine

## 2019-11-11 ENCOUNTER — Encounter: Payer: Self-pay | Admitting: Cardiovascular Disease

## 2019-11-11 ENCOUNTER — Other Ambulatory Visit: Payer: Self-pay

## 2019-11-11 VITALS — BP 130/74 | HR 57 | Ht 66.0 in | Wt 184.0 lb

## 2019-11-11 DIAGNOSIS — R1013 Epigastric pain: Secondary | ICD-10-CM

## 2019-11-11 DIAGNOSIS — R079 Chest pain, unspecified: Secondary | ICD-10-CM

## 2019-11-11 DIAGNOSIS — Z952 Presence of prosthetic heart valve: Secondary | ICD-10-CM

## 2019-11-11 LAB — CBC WITH DIFFERENTIAL/PLATELET

## 2019-11-11 MED ORDER — METOPROLOL TARTRATE 100 MG PO TABS: ORAL_TABLET | ORAL | 0 refills | Status: DC

## 2019-11-11 NOTE — Patient Instructions (Addendum)
Medication Instructions:  *If you need a refill on your cardiac medications before your next appointment, please call your pharmacy*  Lab Work: Your physician recommends that you have lab work today. BMET, LFT, lipase, amylase, CBC  If you have labs (blood work) drawn today and your tests are completely normal, you will receive your results only by: Marland Kitchen MyChart Message (if you have MyChart) OR . A paper copy in the mail If you have any lab test that is abnormal or we need to change your treatment, we will call you to review the results.  Testing/Procedures: Your physician has requested that you have cardiac CT. Cardiac computed tomography (CT) is a painless test that uses an x-ray machine to take clear, detailed pictures of your heart. For further information please visit [email or web address]. Please follow instruction sheet as given.  Follow-Up: At [facility], you and your health needs are our priority.  As part of our continuing mission to provide you with exceptional heart care, we have created designated Provider Care Teams.  These Care Teams include your primary Cardiologist (physician) and Advanced Practice Providers (APPs -  Physician Assistants and Nurse Practitioners) who all work together to provide you with the care you need, when you need it.  We recommend signing up for the patient portal called "MyChart".  Sign up information is provided on this After Visit Summary.  MyChart is used to connect with patients for Virtual Visits (Telemedicine).  Patients are able to view lab/test results, encounter notes, upcoming appointments, etc.  Non-urgent messages can be sent to your provider as well.   To learn more about what you can do with MyChart, go to [email or web address].    Your next appointment:   6 - 8 weeks  The format for your next appointment:   In Person  Provider:   You may see Dr. Johnsie Cancel or one of the following Advanced Practice Providers on your designated Care Team:     Truitt Merle, NP  Cecilie Kicks, NP  Kathyrn Drown, NP   Your cardiac CT will be scheduled at one of the below locations:   [facility] [address] [place], [place] [address] [phone]  If scheduled at [facility], please arrive at the [facility] main entrance of [facility] 30 minutes prior to test start time. Proceed to the [facility] Radiology Department (first floor) to check-in and test prep.  Please follow these instructions carefully (unless otherwise directed):  On the Night Before the Test: . Be sure to Drink plenty of water. . Do not consume any caffeinated/decaffeinated beverages or chocolate 12 hours prior to your test. . Do not take any antihistamines 12 hours prior to your test.  On the Day of the Test: . Drink plenty of water. Do not drink any water within one hour of the test. . Do not eat any food 4 hours prior to the test. . You may take your regular medications prior to the test.  . Take metoprolol (Lopressor) 100 mg two hours prior to test. . FEMALES- please wear underwire-free bra if available  After the Test: . Drink plenty of water. . After receiving IV contrast, you may experience a mild flushed feeling. This is normal. . On occasion, you may experience a mild rash up to 24 hours after the test. This is not dangerous. If this occurs, you can take Benadryl 25 mg and increase your fluid intake. . If you experience trouble breathing, this can be serious. If  it is severe call 911 IMMEDIATELY. If it is mild, please call our office.   Once we have confirmed authorization from your insurance company, we will call you to set up a date and time for your test. Based on how quickly your insurance processes prior authorizations requests, please allow up to 4 weeks to be contacted for scheduling your Cardiac CT appointment. Be advised that routine Cardiac CT appointments could be scheduled as many as 8 weeks after your provider  has ordered it.  For non-scheduling related questions, please contact the cardiac imaging nurse navigator should you have any questions/concerns: Marchia Bond, Cardiac Imaging Nurse Navigator Burley Saver, Interim Cardiac Imaging Nurse [place] and Vascular Services Direct Office Dial: [phone]   For scheduling needs, including cancellations and rescheduling, please call Vivien Rota at [phone], option 3.

## 2019-11-11 NOTE — Telephone Encounter (Signed)
Reaching out to patient to offer assistance regarding upcoming cardiac imaging study; pt verbalizes understanding of appt date/time, parking situation and where to check in, pre-test NPO status and medications ordered, and verified current allergies; name and call back number provided for further questions should they arise Marchia Bond RN Navigator Cardiac Imaging [place] and Vascular [phone] office [phone] cell  Pt verbalized understanding to check HR at 5:30a and if >55bpm she WILL take metoprolol 2 hr prior to scan.   Clarise Cruz

## 2019-11-12 LAB — HEPATIC FUNCTION PANEL
ALT: 22 IU/L (ref 0–32)
AST: 16 IU/L (ref 0–40)
Albumin: 4.6 g/dL (ref 3.8–4.8)
Alkaline Phosphatase: 66 IU/L (ref 44–121)
Bilirubin Total: 0.3 mg/dL (ref 0.0–1.2)
Bilirubin, Direct: <0.1 mg/dL (ref 0.00–0.40)
Total Protein: 7.2 g/dL (ref 6.0–8.5)

## 2019-11-12 LAB — BASIC METABOLIC PANEL
BUN/Creatinine Ratio: 15 (ref 9–23)
BUN: 13 mg/dL (ref 6–24)
CO2: 24 mmol/L (ref 20–29)
Calcium: 9.1 mg/dL (ref 8.7–10.2)
Chloride: 102 mmol/L (ref 96–106)
Creatinine, Ser: 0.87 mg/dL (ref 0.57–1.00)
GFR calc Af Amer: 91 mL/min/{1.73_m2} (ref >59)
GFR calc non Af Amer: 79 mL/min/{1.73_m2} (ref >59)
Glucose: 111 mg/dL — ABNORMAL HIGH (ref 65–99)
Potassium: 4.9 mmol/L (ref 3.5–5.2)
Sodium: 138 mmol/L (ref 134–144)

## 2019-11-12 LAB — CBC WITH DIFFERENTIAL/PLATELET
Basophils Absolute: 0.1 10*3/uL (ref 0.0–0.2)
Basos: 1 %
EOS (ABSOLUTE): 0.2 10*3/uL (ref 0.0–0.4)
Eos: 3 %
Hematocrit: 41.8 % (ref 34.0–46.6)
Hemoglobin: 13.6 g/dL (ref 11.1–15.9)
Immature Grans (Abs): 0 10*3/uL (ref 0.0–0.1)
Immature Granulocytes: 0 %
Lymphocytes Absolute: 2.7 10*3/uL (ref 0.7–3.1)
Lymphs: 32 %
MCH: 28.8 pg (ref 26.6–33.0)
MCHC: 32.5 g/dL (ref 31.5–35.7)
MCV: 89 fL (ref 79–97)
Monocytes Absolute: 0.4 10*3/uL (ref 0.1–0.9)
Monocytes: 5 %
Neutrophils Absolute: 5 10*3/uL (ref 1.4–7.0)
Neutrophils: 59 %
Platelets: 258 10*3/uL (ref 150–450)
RBC: 4.72 x10E6/uL (ref 3.77–5.28)
RDW: 12.6 % (ref 11.7–15.4)
WBC: 8.5 10*3/uL (ref 3.4–10.8)

## 2019-11-12 LAB — AMYLASE: Amylase: 53 U/L (ref 31–110)

## 2019-11-12 LAB — LIPASE: Lipase: 39 U/L (ref 14–72)

## 2019-11-13 ENCOUNTER — Ambulatory Visit (HOSPITAL_COMMUNITY)
Admission: RE | Admit: 2019-11-13 | Discharge: 2019-11-13 | Disposition: A | Discharge status: Home / Self Care | Payer: BC Managed Care – PPO | Source: Ambulatory Visit | Attending: Cardiovascular Disease | Admitting: Cardiovascular Disease

## 2019-11-13 ENCOUNTER — Other Ambulatory Visit: Payer: Self-pay

## 2019-11-13 ENCOUNTER — Telehealth: Payer: Self-pay | Admitting: Family Medicine

## 2019-11-13 DIAGNOSIS — Z952 Presence of prosthetic heart valve: Secondary | ICD-10-CM | POA: Diagnosis not present

## 2019-11-13 DIAGNOSIS — R079 Chest pain, unspecified: Secondary | ICD-10-CM | POA: Diagnosis not present

## 2019-11-13 MED ORDER — NITROGLYCERIN 0.4 MG SL SUBL
0.8000 mg | SUBLINGUAL_TABLET | Freq: Once | SUBLINGUAL | Status: AC
  Administered 2019-11-13: 0.8 mg via SUBLINGUAL

## 2019-11-13 MED ORDER — NITROGLYCERIN 0.4 MG SL SUBL
SUBLINGUAL_TABLET | SUBLINGUAL | Status: AC
  Filled 2019-11-13: qty 2

## 2019-11-13 MED ORDER — IOHEXOL 350 MG/ML SOLN
80.0000 mL | Freq: Once | INTRAVENOUS | Status: AC | PRN
  Administered 2019-11-13: 80 mL via INTRAVENOUS

## 2019-11-13 NOTE — Telephone Encounter (Signed)
Patient last seen in 05/2016, patient would like to re-establish care .  Please Advise

## 2019-11-13 NOTE — Progress Notes (Signed)
States had central chest pressure, achy pain that radiates through to back, 3-4/10 when walking from car to  Waiting room.

## 2019-11-14 NOTE — Telephone Encounter (Signed)
Ok to re-establish 

## 2019-12-09 NOTE — Progress Notes (Signed)
CARDIOLOGY OFFICE NOTE  Date:  12/23/2019    Shelby Eaton Date of Birth: 04/08/1970 Medical Record #[id number]  PCP:  Darreld Mclean, MD  Cardiologist:  Johnsie Cancel  Chief Complaint  Patient presents with  . Follow-up    Seen for Dr. Johnsie Cancel    History of Present Illness: Shelby Eaton is a 49 y.o. female who presents today for a follow up visit. Seen for Dr. Johnsie Cancel.   She has a history of bicuspid aortic valve with severe aortic stenosis. On 02/28/12 she underwent aortic valve replacement at [facility] by [place]. She did well. She has a bovine bioprosthetic valve. Echo done 11/01/18 showed mean gradient decreased from 90mmHg to 20 mmHg no PVL and DVI 0.32.  Seen last month by Dr. Johnsie Cancel - having chest pain - no improvement in treatment for GERD.   Comes in today. Here alone.  She is happy about her CT results. Less chest pain. Her labs were ok. She feels this is reflux - PPI starting to help her. She feels a lot of this is self induced - she has had more stress - ate more with COVID - changing jobs, etc. Very motivated to make changes. BP not at goal but she would like a trial of lifestyle before starting medicines. She does not like Metoprolol - harder to lose weight.   Past Medical History:  Diagnosis Date  . Aortic valve insufficiency, congenital   . Endometrial polyp   . Murmur   . Severe aortic valve stenosis CONGENITAL --  CARDIOLOGIST -- DR BRACKBILL-- LOV IN EPIC   . Vaginal cyst     Past Surgical History:  Procedure Laterality Date  . TRANSTHORACIC ECHOCARDIOGRAM  12-18-2007  DR BRACKBILL   NORMAL LVSF AND DF/ EF 55-60%/ SEVERE CALCIFIC AORTIC STENOSIS W/ MODERATE - SEVERE  AORTIC INSUFFICIENCY/ MILD MITRAL SCLEROSIS/ NORMAL PULMONARY ARTERY PRESSURE / NO SIGNIFICANT CHANGE SINCE LAST ECHO 06-14-2006     Medications: Current Meds  Medication Sig  . aspirin 81 MG tablet Take 81 mg by mouth daily.  Marland Kitchen levonorgestrel (MIRENA, 52 MG,) 20 MCG/24HR IUD Mirena 20  mcg/24 hours (5 yrs) 52 mg intrauterine device  Take 1 device every day by intrauterine route.  . valACYclovir (VALTREX) 1000 MG tablet Take 1 tablet by mouth as needed (breakouts).      Allergies: No Known Allergies  Social History: The patient  reports that she quit smoking about 8 years ago. Her smoking use included cigarettes. She quit after 20.00 years of use. She has never used smokeless tobacco. She reports current alcohol use. She reports that she does not use drugs.   Family History: The patient's family history includes Diabetes in her father and mother; Heart disease in her father and mother; Hypertension in her father; Stroke in her father and mother.   Review of Systems: Please see the history of present illness.   All other systems are reviewed and negative.   Physical Exam: VS:  BP (!) 142/90   Pulse 88   Ht 5\' 6"  (1.676 m)   Wt 185 lb (83.9 kg)   SpO2 97%   BMI 29.86 kg/m  .  BMI Body mass index is 29.86 kg/m.  Wt Readings from Last 3 Encounters:  12/23/19 185 lb (83.9 kg)  11/11/19 184 lb (83.5 kg)  10/25/18 176 lb (79.8 kg)    General: Pleasant. Alert and in no acute distress.   HEENT: Normal.  Neck: Supple, no JVD, carotid bruits,  or masses noted.  Cardiac: Regular rate and rhythm. Outflow murmur. No edema.  Respiratory:  Lungs are clear to auscultation bilaterally with normal work of breathing.  GI: Soft and nontender.  MS: No deformity or atrophy. Gait and ROM intact.  Skin: Warm and dry. Color is normal.  Neuro:  Strength and sensation are intact and no gross focal deficits noted.  Psych: Alert, appropriate and with normal affect.   LABORATORY DATA:  EKG:  EKG is not ordered today.    Lab Results  Component Value Date   WBC 8.5 11/11/2019   HGB 13.6 11/11/2019   HCT 41.8 11/11/2019   PLT 258 11/11/2019   GLUCOSE 111 (H) 11/11/2019   CHOL 171 11/03/2015   TRIG 118.0 11/03/2015   HDL 59.10 11/03/2015   LDLCALC 88 11/03/2015   ALT 22  11/11/2019   AST 16 11/11/2019   NA 138 11/11/2019   K 4.9 11/11/2019   CL 102 11/11/2019   CREATININE 0.87 11/11/2019   BUN 13 11/11/2019   CO2 24 11/11/2019   TSH 2.08 11/03/2015   HGBA1C 5.7 11/03/2015     BNP (last 3 results) No results for input(s): BNP in the last 8760 hours.  ProBNP (last 3 results) No results for input(s): PROBNP in the last 8760 hours.   Other Studies Reviewed Today:  CORONARY CT IMPRESSION 10/2019: 1. Calcium score 0  2.  Normal right dominant coronary arteries  3. Normal appearing 21 mm CE pericardial tissue valve with no HALT/HAM  4. Upper limites normal ascending thoracic aorta 3.8 cm with normal arch vessels and no dissection/hematoma.  Jenkins Rouge    ECHO IMPRESSIONS 10/2018  1. Left ventricular ejection fraction, by visual estimation, is 60 to  65%. The left ventricle has normal function. Normal left ventricular size.  There is no left ventricular hypertrophy.  2. Left ventricular diastolic Doppler parameters are indeterminate  pattern of LV diastolic filling.  3. Global right ventricle has normal systolic function.The right  ventricular size is normal. No increase in right ventricular wall  thickness.  4. Left atrial size was normal.  5. Right atrial size was normal.  6. The mitral valve is normal in structure. No evidence of mitral valve  regurgitation. No evidence of mitral stenosis.  7. The tricuspid valve is normal in structure. Tricuspid valve  regurgitation was not visualized by color flow Doppler.  8. Aortic valve regurgitation was not visualized by color flow Doppler.  Mild to moderate aortic valve stenosis.  9. The pulmonic valve was normal in structure. Pulmonic valve  regurgitation is not visualized by color flow Doppler.  10. Normal pulmonary artery systolic pressure.  11. The inferior vena cava is normal in size with greater than 50%  respiratory variability, suggesting right atrial pressure of 3  mmHg.  12. S/P 21 mm bioprosthetic aortic valve, which is functioning normally.  Gradients/DI similar to prior study.    ASSESSMENT AND PLAN:  1. Prior AVR - stable function by recent CT. Continue SBE.   2. Chest pain - normal coronary CT - favor GI component - may still need GB ultrasound - she will follow up with PCP  3. HTN - not at goal - wanting to work on lifestyle before starting medicines - explained that there are other options than just Metoprolol. Will follow for now and she is to monitor.  Goal 130/80 or less consistently.    4. Obesity - very motivated to make changes.   Current medicines are reviewed  with the patient today.  The patient does not have concerns regarding medicines other than what has been noted above.  The following changes have been made:  See above.  Labs/ tests ordered today include:   No orders of the defined types were placed in this encounter.    Disposition:   FU with Dr. Johnsie Cancel in 6 months.    Patient is agreeable to this plan and will call if any problems develop in the interim.   SignedTruitt Merle, NP  12/23/2019 10:02 AM  [facility] [address] [place], [place]  [address] Phone: [phone] Fax: [phone]

## 2019-12-23 ENCOUNTER — Encounter: Payer: Self-pay | Admitting: Nurse Practitioner

## 2019-12-23 ENCOUNTER — Other Ambulatory Visit: Payer: Self-pay

## 2019-12-23 ENCOUNTER — Ambulatory Visit (INDEPENDENT_AMBULATORY_CARE_PROVIDER_SITE_OTHER): Payer: BC Managed Care – PPO | Admitting: Nurse Practitioner

## 2019-12-23 VITALS — BP 142/90 | HR 88 | Ht 66.0 in | Wt 185.0 lb

## 2019-12-23 DIAGNOSIS — Z952 Presence of prosthetic heart valve: Secondary | ICD-10-CM

## 2019-12-23 DIAGNOSIS — I10 Essential (primary) hypertension: Secondary | ICD-10-CM | POA: Diagnosis not present

## 2019-12-23 DIAGNOSIS — R1013 Epigastric pain: Secondary | ICD-10-CM | POA: Diagnosis not present

## 2019-12-23 DIAGNOSIS — R079 Chest pain, unspecified: Secondary | ICD-10-CM | POA: Diagnosis not present

## 2019-12-23 NOTE — Patient Instructions (Addendum)
After Visit Summary:  We will be checking the following labs today - NONE   Medication Instructions:    Continue with your current medicines.    If you need a refill on your cardiac medications before your next appointment, please call your pharmacy.     Testing/Procedures To Be Arranged:  N/A  Follow-Up:   See Dr. Johnsie Cancel in about 6 months     At [facility], you and your health needs are our priority.  As part of our continuing mission to provide you with exceptional heart care, we have created designated Provider Care Teams.  These Care Teams include your primary Cardiologist (physician) and Advanced Practice Providers (APPs -  Physician Assistants and Nurse Practitioners) who all work together to provide you with the care you need, when you need it.  Special Instructions:  . Stay safe, wash your hands for at least 20 seconds and wear a mask when needed.  . It was good to talk with you today. . Think about what we talked about today.     Call the [place] office at [phone] if you have any questions, problems or concerns.

## 2020-06-16 ENCOUNTER — Ambulatory Visit: Payer: BC Managed Care – PPO | Admitting: Cardiovascular Disease

## 2020-08-17 NOTE — Progress Notes (Signed)
CARDIOLOGY OFFICE NOTE  Date:  08/23/2020    Mertha Eaton Date of Birth: 23-Jul-1970 Medical Record [id number]  PCP:  Darreld Mclean, MD  Cardiologist:  Johnsie Cancel   History of Present Illness: Shelby Eaton is a 50 y.o. female who presents today for a follow up visit.    She has a history of bicuspid aortic valve with severe aortic stenosis. On 02/28/12 she underwent aortic valve replacement at [facility] by [place]. She did well. She has a bovine bioprosthetic valve. Echo done 11/01/18 showed mean gradient decreased from 24mHg to 20 mmHg no PVL and DVI 0.32.  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 use more     Past Medical History:  Diagnosis Date   Aortic valve insufficiency, congenital    Endometrial polyp    Murmur    Severe aortic valve stenosis CONGENITAL --  CARDIOLOGIST -- DR BRACKBILL-- LOV IN EPIC    Vaginal cyst     Past Surgical History:  Procedure Laterality Date   TRANSTHORACIC ECHOCARDIOGRAM  12-18-2007  DR BRACKBILL   NORMAL LVSF AND DF/ EF 55-60%/ SEVERE CALCIFIC AORTIC STENOSIS W/ MODERATE - SEVERE  AORTIC INSUFFICIENCY/ MILD MITRAL SCLEROSIS/ NORMAL PULMONARY ARTERY PRESSURE / NO SIGNIFICANT CHANGE SINCE LAST ECHO 06-14-2006     Medications: Current Meds  Medication Sig   aspirin 81 MG tablet Take 81 mg by mouth daily.   levonorgestrel (MIRENA, 52 MG,) 20 MCG/24HR IUD Mirena 20 mcg/24 hours (5 yrs) 52 mg intrauterine device  Take 1 device every day by intrauterine route.   valACYclovir (VALTREX) 1000 MG tablet Take 1 tablet by mouth as needed (breakouts).      Allergies: No Known Allergies  Social History: The  patient  reports that she quit smoking about 9 years ago. Her smoking use included cigarettes. She has never used smokeless tobacco. She reports current alcohol use. She reports that she does not use drugs.   Family History: The patient's family history includes Diabetes in her father and mother; Heart disease in her father and mother; Hypertension in her father; Stroke in her father and mother.   Review of Systems: Please see the history of present illness.   All other systems are reviewed and negative.   Physical Exam: VS:  BP 112/86   Pulse 68   Ht '5\' 6"'$  (1.676 m)   Wt 74.8 kg   SpO2 99%   BMI 26.63 kg/m  .  BMI Body mass index is 26.63 kg/m.  Wt Readings from Last 3 Encounters:  08/23/20 74.8 kg  12/23/19 83.9 kg  11/11/19 83.5 kg    Affect appropriate Healthy:  appears stated age H82 normal Neck supple with no adenopathy JVP normal no bruits no thyromegaly Lungs clear with no wheezing and good diaphragmatic motion Heart:  S1/S2 SEM thorught AVR no AR murmur, no rub, gallop or click PMI normal post sternotomy  Abdomen: benighn, BS positve, no tenderness, no AAA no bruit.  No HSM or HJR Distal pulses intact with no bruits No edema Neuro non-focal Skin warm and dry No muscular weakness    LABORATORY DATA:  EKG:  EKG  is not ordered today.    Lab Results  Component Value Date   WBC 8.5 11/11/2019   HGB 13.6 11/11/2019   HCT 41.8 11/11/2019   PLT 258 11/11/2019   GLUCOSE 111 (H) 11/11/2019   CHOL 171 11/03/2015   TRIG 118.0 11/03/2015   HDL 59.10 11/03/2015   LDLCALC 88 11/03/2015   ALT 22 11/11/2019   AST 16 11/11/2019   NA 138 11/11/2019   K 4.9 11/11/2019   CL 102 11/11/2019   CREATININE 0.87 11/11/2019   BUN 13 11/11/2019   CO2 24 11/11/2019   TSH 2.08 11/03/2015   HGBA1C 5.7 11/03/2015     BNP (last 3 results) No results for input(s): BNP in the last 8760 hours.  ProBNP (last 3 results) No results for input(s): PROBNP in the last 8760  hours.   Other Studies Reviewed Today:  CORONARY CT IMPRESSION 10/2019: 1. Calcium score 0   2.  Normal right dominant coronary arteries   3. Normal appearing 21 mm CE pericardial tissue valve with no HALT/HAM   4. Upper limites normal ascending thoracic aorta 3.8 cm with normal arch vessels and no dissection/hematoma.   Jenkins Rouge      ECHO IMPRESSIONS 10/2018   1. Left ventricular ejection fraction, by visual estimation, is 60 to  65%. The left ventricle has normal function. Normal left ventricular size.  There is no left ventricular hypertrophy.   2. Left ventricular diastolic Doppler parameters are indeterminate  pattern of LV diastolic filling.   3. Global right ventricle has normal systolic function.The right  ventricular size is normal. No increase in right ventricular wall  thickness.   4. Left atrial size was normal.   5. Right atrial size was normal.   6. The mitral valve is normal in structure. No evidence of mitral valve  regurgitation. No evidence of mitral stenosis.   7. The tricuspid valve is normal in structure. Tricuspid valve  regurgitation was not visualized by color flow Doppler.   8. Aortic valve regurgitation was not visualized by color flow Doppler.  Mild to moderate aortic valve stenosis.   9. The pulmonic valve was normal in structure. Pulmonic valve  regurgitation is not visualized by color flow Doppler.  10. Normal pulmonary artery systolic pressure.  11. The inferior vena cava is normal in size with greater than 50%  respiratory variability, suggesting right atrial pressure of 3 mmHg.  12. S/P 21 mm bioprosthetic aortic valve, which is functioning normally.  Gradients/DI similar to prior study.     ASSESSMENT AND PLAN:   1. Prior AVR - stable function by recent CT. Continue SBE. TTE October 2023  2. Chest pain - normal coronary CT - favor GI component    3. HTN - does not like beta blockers  .    4. Obesity - very motivated to make  changes.   Current medicines are reviewed with the patient today.  The patient does not have concerns regarding medicines other than what has been noted above.  The following changes have been made:  See above.  Labs/ tests ordered today include:   No orders of the defined types were placed in this encounter.    Disposition:   FU with in a year    Patient is agreeable to this plan and will call if any problems develop in the interim.   Signed: Jenkins Rouge, MD  08/23/2020 8:36 AM  [facility] [address] [place],  [place]  [address] Phone: [phone] Fax: [phone]

## 2020-08-23 ENCOUNTER — Encounter: Payer: Self-pay | Admitting: Cardiovascular Disease

## 2020-08-23 ENCOUNTER — Ambulatory Visit (INDEPENDENT_AMBULATORY_CARE_PROVIDER_SITE_OTHER): Payer: 59 | Admitting: Cardiovascular Disease

## 2020-08-23 ENCOUNTER — Other Ambulatory Visit: Payer: Self-pay

## 2020-08-23 VITALS — BP 112/86 | HR 68 | Ht 66.0 in | Wt 165.0 lb

## 2020-08-23 DIAGNOSIS — Z952 Presence of prosthetic heart valve: Secondary | ICD-10-CM | POA: Diagnosis not present

## 2020-08-23 DIAGNOSIS — I10 Essential (primary) hypertension: Secondary | ICD-10-CM

## 2020-08-23 DIAGNOSIS — R079 Chest pain, unspecified: Secondary | ICD-10-CM

## 2020-08-23 NOTE — Patient Instructions (Signed)
Medication Instructions:  *If you need a refill on your cardiac medications before your next appointment, please call your pharmacy*  Lab Work: If you have labs (blood work) drawn today and your tests are completely normal, you will receive your results only by: [place] (if you have MyChart) OR A paper copy in the mail If you have any lab test that is abnormal or we need to change your treatment, we will call you to review the results.  Testing/Procedures: Your physician has requested that you have an echocardiogram same day as office visit in 2024. Echocardiography is a painless test that uses sound waves to create images of your heart. It provides your doctor with information about the size and shape of your heart and how well your heart's chambers and valves are working. This procedure takes approximately one hour. There are no restrictions for this procedure.   Follow-Up: At [facility], you and your health needs are our priority.  As part of our continuing mission to provide you with exceptional heart care, we have created designated Provider Care Teams.  These Care Teams include your primary Cardiologist (physician) and Advanced Practice Providers (APPs -  Physician Assistants and Nurse Practitioners) who all work together to provide you with the care you need, when you need it.  We recommend signing up for the patient portal called "MyChart".  Sign up information is provided on this After Visit Summary.  MyChart is used to connect with patients for Virtual Visits (Telemedicine).  Patients are able to view lab/test results, encounter notes, upcoming appointments, etc.  Non-urgent messages can be sent to your provider as well.   To learn more about what you can do with MyChart, go to [email or web address].    Your next appointment:   In October 2024  The format for your next appointment:   In Person  Provider:   You may see Dr. Johnsie Cancel or one of the following Advanced  Practice Providers on your designated Care Team:   Cecilie Kicks, NP

## 2021-10-13 LAB — HM MAMMOGRAPHY: HM Mammogram: NORMAL (ref 0–4)

## 2021-12-19 ENCOUNTER — Telehealth: Payer: Self-pay | Admitting: Cardiovascular Disease

## 2021-12-19 DIAGNOSIS — Z952 Presence of prosthetic heart valve: Secondary | ICD-10-CM

## 2021-12-19 NOTE — Telephone Encounter (Signed)
Patient stated she is due for an Echo test.  Patient's annual f/u appointment is scheduled for 1/29.

## 2021-12-19 NOTE — Telephone Encounter (Signed)
Called and spoke to patient. Echo scheduled for 1/26 at 7:15 AM.

## 2021-12-29 ENCOUNTER — Encounter: Payer: Self-pay | Admitting: Cardiovascular Disease

## 2021-12-29 DIAGNOSIS — R079 Chest pain, unspecified: Secondary | ICD-10-CM

## 2021-12-29 DIAGNOSIS — Z953 Presence of xenogenic heart valve: Secondary | ICD-10-CM

## 2021-12-29 DIAGNOSIS — I350 Nonrheumatic aortic (valve) stenosis: Secondary | ICD-10-CM

## 2021-12-30 NOTE — Addendum Note (Signed)
Addended by: Aris Georgia, Austine Wiedeman L on: 12/30/2021 08:16 AM   Modules accepted: Orders

## 2021-12-30 NOTE — Telephone Encounter (Signed)
Called patient and scheduled her with Dr. Johnsie Cancel on 01/03/22. Placed order for echo ASAP, will try to get before her appointment. Patient verbalized understanding.

## 2022-01-03 ENCOUNTER — Ambulatory Visit: Payer: 59 | Attending: Cardiovascular Disease | Admitting: Cardiovascular Disease

## 2022-01-03 ENCOUNTER — Encounter: Payer: Self-pay | Admitting: Cardiovascular Disease

## 2022-01-03 VITALS — BP 134/88 | HR 58 | Ht 66.0 in | Wt 179.8 lb

## 2022-01-03 DIAGNOSIS — Z953 Presence of xenogenic heart valve: Secondary | ICD-10-CM | POA: Diagnosis not present

## 2022-01-03 DIAGNOSIS — I10 Essential (primary) hypertension: Secondary | ICD-10-CM | POA: Diagnosis not present

## 2022-01-03 DIAGNOSIS — F411 Generalized anxiety disorder: Secondary | ICD-10-CM

## 2022-01-03 DIAGNOSIS — R079 Chest pain, unspecified: Secondary | ICD-10-CM

## 2022-01-03 NOTE — Patient Instructions (Signed)
Medication Instructions:  Your physician recommends that you continue on your current medications as directed. Please refer to the Current Medication list given to you today.  *If you need a refill on your cardiac medications before your next appointment, please call your pharmacy*  Lab Work: If you have labs (blood work) drawn today and your tests are completely normal, you will receive your results only by: [place] (if you have MyChart) OR A paper copy in the mail If you have any lab test that is abnormal or we need to change your treatment, we will call you to review the results.  Testing/Procedures: Your physician has requested that you have an echocardiogram already scheduled. Echocardiography is a painless test that uses sound waves to create images of your heart. It provides your doctor with information about the size and shape of your heart and how well your heart's chambers and valves are working. This procedure takes approximately one hour. There are no restrictions for this procedure. Please do NOT wear cologne, perfume, aftershave, or lotions (deodorant is allowed). Please arrive 15 minutes prior to your appointment time.  Follow-Up: At [facility], you and your health needs are our priority.  As part of our continuing mission to provide you with exceptional heart care, we have created designated Provider Care Teams.  These Care Teams include your primary Cardiologist (physician) and Advanced Practice Providers (APPs -  Physician Assistants and Nurse Practitioners) who all work together to provide you with the care you need, when you need it.  We recommend signing up for the patient portal called "MyChart".  Sign up information is provided on this After Visit Summary.  MyChart is used to connect with patients for Virtual Visits (Telemedicine).  Patients are able to view lab/test results, encounter notes, upcoming appointments, etc.  Non-urgent messages can be sent to  your provider as well.   To learn more about what you can do with MyChart, go to [email or web address].    Your next appointment:   6 month(s)  The format for your next appointment:   In Person  Provider:   Jenkins Rouge, MD      Important Information About Sugar

## 2022-01-03 NOTE — Progress Notes (Signed)
CARDIOLOGY OFFICE NOTE  Date:  01/03/2022    Shelby Eaton Date of Birth: 1970-05-21 Medical Record #[id number]  PCP:  Darreld Mclean, MD  Cardiologist:  Johnsie Cancel   History of Present Illness: Shelby Eaton is a 51 y.o. female who presents today for a follow up visit.    She has a history of bicuspid aortic valve with severe aortic stenosis. On 02/28/12 she underwent aortic valve replacement at [facility] by [place]. She did well. She has a bovine bioprosthetic valve. Echo done 10/2019  showed mean gradient decreased from 81mHg to 20 mmHg no PVL and DVI 0.32.  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     Past Medical History:  Diagnosis Date   Aortic valve insufficiency, congenital    Endometrial polyp    Murmur    Severe aortic valve stenosis CONGENITAL --  CARDIOLOGIST -- DR BRACKBILL-- LOV IN EPIC    Vaginal cyst     Past Surgical History:  Procedure Laterality Date   TRANSTHORACIC ECHOCARDIOGRAM  12-18-2007  DR BRACKBILL   NORMAL LVSF AND DF/ EF 55-60%/ SEVERE CALCIFIC AORTIC STENOSIS W/ MODERATE - SEVERE  AORTIC INSUFFICIENCY/ MILD MITRAL SCLEROSIS/ NORMAL PULMONARY ARTERY PRESSURE / NO SIGNIFICANT CHANGE SINCE LAST ECHO 06-14-2006     Medications: Current Meds  Medication Sig   aspirin 81 MG tablet Take 81 mg by mouth daily.   levonorgestrel (MIRENA, 52 MG,) 20 MCG/24HR IUD Mirena 20  mcg/24 hours (5 yrs) 52 mg intrauterine device  Take 1 device every day by intrauterine route.   valACYclovir (VALTREX) 1000 MG tablet Take 1 tablet by mouth as needed (breakouts).      Allergies: No Known Allergies  Social History: The patient  reports that she quit smoking about 10 years ago. Her smoking use included cigarettes. She has never used smokeless tobacco. She reports current alcohol use. She reports that she does not use drugs.   Family History: The patient's family history includes Diabetes in her father and mother; Heart disease in her father and mother; Hypertension in her father; Stroke in her father and mother.   Review of Systems: Please see the history of present illness.   All other systems are reviewed and negative.   Physical Exam: VS:  BP 134/88   Pulse (!) 58   Ht '5\' 6"'$  (1.676 m)   Wt 179 lb 12.8 oz (81.6 kg)   SpO2 98%   BMI 29.02 kg/m  .  BMI Body mass index is 29.02 kg/m.  Wt Readings from Last 3 Encounters:  01/03/22 179 lb 12.8 oz (81.6 kg)  08/23/20 165 lb (74.8 kg)  12/23/19 185 lb (83.9 kg)    Affect appropriate Healthy:  appears stated age HEENT: normal Neck supple with no adenopathy JVP normal no bruits no thyromegaly Lungs clear with no wheezing  and good diaphragmatic motion Heart:  S1/S2 SEM thorught AVR no AR murmur, no rub, gallop or click PMI normal post sternotomy  Abdomen: benighn, BS positve, no tenderness, no AAA no bruit.  No HSM or HJR Distal pulses intact with no bruits No edema Neuro non-focal Skin warm and dry No muscular weakness    LABORATORY DATA:  EKG:  SR limb lead reversal IVCE poor R wave progression   Lab Results  Component Value Date   WBC 8.5 11/11/2019   HGB 13.6 11/11/2019   HCT 41.8 11/11/2019   PLT 258 11/11/2019   GLUCOSE 111 (H) 11/11/2019   CHOL 171 11/03/2015   TRIG 118.0 11/03/2015   HDL 59.10 11/03/2015   LDLCALC 88 11/03/2015   ALT 22 11/11/2019   AST 16 11/11/2019   NA 138  11/11/2019   K 4.9 11/11/2019   CL 102 11/11/2019   CREATININE 0.87 11/11/2019   BUN 13 11/11/2019   CO2 24 11/11/2019   TSH 2.08 11/03/2015   HGBA1C 5.7 11/03/2015     BNP (last 3 results) No results for input(s): "BNP" in the last 8760 hours.  ProBNP (last 3 results) No results for input(s): "PROBNP" in the last 8760 hours.   Other Studies Reviewed Today:  CORONARY CT IMPRESSION 10/2019: 1. Calcium score 0   2.  Normal right dominant coronary arteries   3. Normal appearing 21 mm CE pericardial tissue valve with no HALT/HAM   4. Upper limites normal ascending thoracic aorta 3.8 cm with normal arch vessels and no dissection/hematoma.   Jenkins Rouge      ECHO IMPRESSIONS 10/2018   1. Left ventricular ejection fraction, by visual estimation, is 60 to  65%. The left ventricle has normal function. Normal left ventricular size.  There is no left ventricular hypertrophy.   2. Left ventricular diastolic Doppler parameters are indeterminate  pattern of LV diastolic filling.   3. Global right ventricle has normal systolic function.The right  ventricular size is normal. No increase in right ventricular wall  thickness.   4. Left atrial size was normal.   5. Right atrial size was normal.   6. The mitral valve is normal in structure. No evidence of mitral valve  regurgitation. No evidence of mitral stenosis.   7. The tricuspid valve is normal in structure. Tricuspid valve  regurgitation was not visualized by color flow Doppler.   8. Aortic valve regurgitation was not visualized by color flow Doppler.  Mild to moderate aortic valve stenosis.   9. The pulmonic valve was normal in structure. Pulmonic valve  regurgitation is not visualized by color flow Doppler.  10. Normal pulmonary artery systolic pressure.  11. The inferior vena cava is normal in size with greater than 50%  respiratory variability, suggesting right atrial pressure of 3 mmHg.  12. S/P 21 mm bioprosthetic  aortic valve, which is functioning normally.  Gradients/DI similar to prior study.     ASSESSMENT AND PLAN:   1. Prior AVR - stable function by recent CT. Continue SBE. TTE pending  2. Chest pain - normal coronary C 11/13/19 T - favor GI component  Aortic root only 3.8 cm  Very atypical non exertional ? Related to anxiety no need for stress testing   3. HTN - does not like beta blockers  .    4. Obesity - very motivated to make changes.   Current medicines are reviewed with the patient today.  The patient does not have concerns regarding medicines other than  what has been noted above.  The following changes have been made:  See above.  Labs/ tests ordered today include:   No orders of the defined types were placed in this encounter.     Disposition:   TTE as soon as we can schedule f/u 6 months    Patient is agreeable to this plan and will call if any problems develop in the interim.   Signed: Jenkins Rouge, MD  01/03/2022 2:46 PM  [facility] [address] [place], [place]  [address] Phone: [phone] Fax: [phone]

## 2022-01-26 ENCOUNTER — Ambulatory Visit (HOSPITAL_COMMUNITY): Payer: 59 | Attending: Cardiovascular Disease

## 2022-01-26 DIAGNOSIS — R079 Chest pain, unspecified: Secondary | ICD-10-CM | POA: Diagnosis not present

## 2022-01-26 DIAGNOSIS — I350 Nonrheumatic aortic (valve) stenosis: Secondary | ICD-10-CM

## 2022-01-26 DIAGNOSIS — Z953 Presence of xenogenic heart valve: Secondary | ICD-10-CM | POA: Diagnosis present

## 2022-01-26 LAB — ECHOCARDIOGRAM COMPLETE
AR max vel: 0.82 cm2
AV Area VTI: 0.89 cm2
AV Area mean vel: 0.8 cm2
AV Mean grad: 17 mmHg
AV Peak grad: 31.6 mmHg
Ao pk vel: 2.81 m/s
Area-P 1/2: 3.6 cm2
S' Lateral: 2.8 cm

## 2022-02-03 ENCOUNTER — Telehealth: Payer: Self-pay | Admitting: Cardiovascular Disease

## 2022-02-03 DIAGNOSIS — Z953 Presence of xenogenic heart valve: Secondary | ICD-10-CM

## 2022-02-03 DIAGNOSIS — I350 Nonrheumatic aortic (valve) stenosis: Secondary | ICD-10-CM

## 2022-02-03 NOTE — Telephone Encounter (Signed)
Per Dr. Johnsie Cancel, Old bioprothetic AV gradients up repeat echo in 6 months EF normal.  Left message for patient to call back.

## 2022-02-03 NOTE — Telephone Encounter (Signed)
Patient was returning call. Please advise ?

## 2022-02-03 NOTE — Telephone Encounter (Signed)
Patient called back for results. Will place order for echo to have done right before her next office visit. Will see if we can get I done on the same day.

## 2022-02-10 ENCOUNTER — Encounter: Payer: Self-pay | Admitting: Nurse Practitioner

## 2022-02-10 ENCOUNTER — Ambulatory Visit (INDEPENDENT_AMBULATORY_CARE_PROVIDER_SITE_OTHER): Payer: 59 | Admitting: Nurse Practitioner

## 2022-02-10 VITALS — BP 130/84 | HR 74 | Temp 97.0°F | Ht 65.5 in | Wt 180.2 lb

## 2022-02-10 DIAGNOSIS — Z953 Presence of xenogenic heart valve: Secondary | ICD-10-CM | POA: Diagnosis not present

## 2022-02-10 DIAGNOSIS — Z1211 Encounter for screening for malignant neoplasm of colon: Secondary | ICD-10-CM | POA: Diagnosis not present

## 2022-02-10 DIAGNOSIS — F419 Anxiety disorder, unspecified: Secondary | ICD-10-CM

## 2022-02-10 MED ORDER — VENLAFAXINE HCL ER 37.5 MG PO CP24: 37.5000 mg | ORAL_CAPSULE | Freq: Every day | ORAL | 1 refills | Status: DC

## 2022-02-10 NOTE — Assessment & Plan Note (Signed)
Chronic, stable.  She had her valve replaced in 2014 and has been doing well since then.  She follows with cardiology regularly.  Continue antibiotics prophylactically before dental appointments.

## 2022-02-10 NOTE — Patient Instructions (Addendum)
It was great to see you!  Start venlafaxine 1 capsule daily.   I recommend regular exercise and sleep to help.   I have ordered the cologuard, it will be mailed to your house.   Let's follow-up in 4-6 weeks, sooner if you have concerns.  If a referral was placed today, you will be contacted for an appointment. Please note that routine referrals can sometimes take up to 3-4 weeks to process. Please call our office if you haven't heard anything after this time frame.  Take care,  Vance Peper, NP

## 2022-02-10 NOTE — Progress Notes (Signed)
New Patient Visit  BP 130/84 (BP Location: Left Arm, Cuff Size: Normal)   Pulse 74   Temp (!) 97 F (36.1 C)   Ht 5' 5.5" (1.664 m)   Wt 180 lb 3.2 oz (81.7 kg)   SpO2 93%   BMI 29.53 kg/m    Subjective:    Patient ID: Shelby Eaton, female    DOB: [birth year], 52 y.o.   MRN: [id number]  CC: Chief Complaint  Patient presents with   New Patient (Initial Visit)    New patient , discuss mood changes thinks that is menopause  discuss possible medication options , hot flashes , having some chest pain when to cardiology and told that it could possible be anxiety     HPI: Shelby Eaton is a 52 y.o. female presents for new patient visit to establish care.  Introduced to Designer, jewellery role and practice setting.  All questions answered.  Discussed provider/patient relationship and expectations.  [name] has been following with her GYN regularly, however has not seen a PCP since the pandemic.  She is looking to reestablish care with a primary care provider.  She has a history of a congenital heart valve disease which required replacement in 2014.  She states that she is doing well since then.  She is not on any blood thinners currently, however does take antibiotics before going to dental appointments.  She has noticed that over the past several months that she has been more snappy, grouchy, and having mood swings.  She states that this is unlike herself.  She also noticed that she was having some chest pain that felt like her heart valve issues in the past.  She went to her cardiologist who said that everything was normal and the chest pain is most likely due to stress and anxiety.  She has been having hot flashes every hour that last about 5 minutes.  She is thinking this may all be related to her anxiety and perimenopause.  She has a Mirena IUD in place and does not get regular periods.  She states that she has taken Prozac in the past when her mom passed away, however it was for short  period of time. She denies SI/HI.      02/10/2022   11:45 AM  Depression screen PHQ 2/9  Decreased Interest 1  Down, Depressed, Hopeless 0  PHQ - 2 Score 1  Altered sleeping 1  Tired, decreased energy 3  Change in appetite 1  Feeling bad or failure about yourself  0  Trouble concentrating 3  Moving slowly or fidgety/restless 0  Suicidal thoughts 0  PHQ-9 Score 9  Difficult doing work/chores Somewhat difficult      02/10/2022   11:45 AM  GAD 7 : Generalized Anxiety Score  Nervous, Anxious, on Edge 3  Control/stop worrying 2  Worry too much - different things 3  Trouble relaxing 3  Restless 0  Easily annoyed or irritable 3  Afraid - awful might happen 1  Total GAD 7 Score 15  Anxiety Difficulty Somewhat difficult    Past Medical History:  Diagnosis Date   Anxiety 2014   Aortic valve insufficiency, congenital    Endometrial polyp    Murmur    Severe aortic valve stenosis CONGENITAL --  CARDIOLOGIST -- DR BRACKBILL-- LOV IN EPIC    Vaginal cyst     Past Surgical History:  Procedure Laterality Date   CARDIAC VALVE REPLACEMENT  2014   Fixed congenital aortic  valve issues   TRANSTHORACIC ECHOCARDIOGRAM  12-18-2007  DR BRACKBILL   NORMAL LVSF AND DF/ EF 55-60%/ SEVERE CALCIFIC AORTIC STENOSIS W/ MODERATE - SEVERE  AORTIC INSUFFICIENCY/ MILD MITRAL SCLEROSIS/ NORMAL PULMONARY ARTERY PRESSURE / NO SIGNIFICANT CHANGE SINCE LAST ECHO 06-14-2006    Family History  Problem Relation Age of Onset   Heart disease Mother    Stroke Mother    Diabetes Mother    Heart disease Father    Stroke Father    Hypertension Father    Diabetes Father    Cancer Maternal Aunt        breast cancer     Social History   Tobacco Use   Smoking status: Former    Years: 20.00    Types: Cigarettes, E-cigarettes    Quit date: 08/16/2011    Years since quitting: 10.4   Smokeless tobacco: Never   Tobacco comments:    I quit years ago...  Vaping Use   Vaping Use: Never used  Substance  Use Topics   Alcohol use: Yes    Alcohol/week: 2.0 standard drinks of alcohol    Types: 2 Glasses of wine per week    Comment: weekends   Drug use: No    Current Outpatient Medications on File Prior to Visit  Medication Sig Dispense Refill   levonorgestrel (MIRENA, 52 MG,) 20 MCG/24HR IUD Mirena 20 mcg/24 hours (5 yrs) 52 mg intrauterine device  Take 1 device every day by intrauterine route.     valACYclovir (VALTREX) 1000 MG tablet Take 1 tablet by mouth as needed (breakouts).      aspirin 81 MG tablet Take 81 mg by mouth daily. (Patient not taking: Reported on 02/10/2022)     No current facility-administered medications on file prior to visit.     Review of Systems  Constitutional:  Positive for fatigue. Negative for fever.  HENT: Negative.    Eyes: Negative.   Respiratory: Negative.    Cardiovascular:  Positive for chest pain (intermittent).  Gastrointestinal: Negative.   Genitourinary:  Positive for frequency. Negative for dysuria.       Hot flashes  Musculoskeletal: Negative.   Skin: Negative.   Neurological:  Positive for dizziness (intermittent).  Psychiatric/Behavioral:  The patient is nervous/anxious.       Objective:    BP 130/84 (BP Location: Left Arm, Cuff Size: Normal)   Pulse 74   Temp (!) 97 F (36.1 C)   Ht 5' 5.5" (1.664 m)   Wt 180 lb 3.2 oz (81.7 kg)   SpO2 93%   BMI 29.53 kg/m   Wt Readings from Last 3 Encounters:  02/10/22 180 lb 3.2 oz (81.7 kg)  01/03/22 179 lb 12.8 oz (81.6 kg)  08/23/20 165 lb (74.8 kg)    BP Readings from Last 3 Encounters:  02/10/22 130/84  01/03/22 134/88  08/23/20 112/86    Physical Exam Vitals and nursing note reviewed.  Constitutional:      General: She is not in acute distress.    Appearance: Normal appearance.  HENT:     Head: Normocephalic and atraumatic.     Right Ear: Tympanic membrane, ear canal and external ear normal.     Left Ear: Tympanic membrane, ear canal and external ear normal.  Eyes:      Conjunctiva/sclera: Conjunctivae normal.  Cardiovascular:     Rate and Rhythm: Normal rate and regular rhythm.     Pulses: Normal pulses.     Heart sounds: Murmur heard.  Pulmonary:  Effort: Pulmonary effort is normal.     Breath sounds: Normal breath sounds.  Abdominal:     Palpations: Abdomen is soft.     Tenderness: There is no abdominal tenderness.  Musculoskeletal:        General: Normal range of motion.     Cervical back: Normal range of motion and neck supple.     Right lower leg: No edema.     Left lower leg: No edema.  Lymphadenopathy:     Cervical: No cervical adenopathy.  Skin:    General: Skin is warm and dry.  Neurological:     General: No focal deficit present.     Mental Status: She is alert and oriented to person, place, and time.     Cranial Nerves: No cranial nerve deficit.     Coordination: Coordination normal.     Gait: Gait normal.  Psychiatric:        Mood and Affect: Mood normal.        Behavior: Behavior normal.        Thought Content: Thought content normal.        Judgment: Judgment normal.       Assessment & Plan:   Problem List Items Addressed This Visit       Other   S/P aortic valve replacement with bioprosthetic valve    Chronic, stable.  She had her valve replaced in 2014 and has been doing well since then.  She follows with cardiology regularly.  Continue antibiotics prophylactically before dental appointments.      Anxiety - Primary    She has been having an increase in anxiety, mood swings, irritability.  She has also been having hot flashes regularly.  This change could be most likely from perimenopause.  She states that it is starting to interfere with her everyday life.  She denies SI/HI.  Her PHQ-9 is a 9 and her GAD-7 is a 15.  She is interested in medication to help today.  Will have her start Effexor 37.5 mg daily.  Discussed possible side effects.  Follow-up in 4 to 6 weeks.      Relevant Medications   venlafaxine XR  (EFFEXOR XR) 37.5 MG 24 hr capsule   Other Visit Diagnoses     Screen for colon cancer       Discussed colon cancer screening options and would like Cologuard.  Order placed today.   Relevant Orders   Cologuard        Follow up plan: Return in about 4 weeks (around 03/10/2022) for CPE.

## 2022-02-10 NOTE — Assessment & Plan Note (Signed)
She has been having an increase in anxiety, mood swings, irritability.  She has also been having hot flashes regularly.  This change could be most likely from perimenopause.  She states that it is starting to interfere with her everyday life.  She denies SI/HI.  Her PHQ-9 is a 9 and her GAD-7 is a 15.  She is interested in medication to help today.  Will have her start Effexor 37.5 mg daily.  Discussed possible side effects.  Follow-up in 4 to 6 weeks.

## 2022-02-17 ENCOUNTER — Other Ambulatory Visit (HOSPITAL_COMMUNITY): Payer: 59

## 2022-02-20 ENCOUNTER — Ambulatory Visit: Payer: 59 | Admitting: Cardiovascular Disease

## 2022-03-08 LAB — COLOGUARD: COLOGUARD: NEGATIVE

## 2022-03-13 ENCOUNTER — Ambulatory Visit (INDEPENDENT_AMBULATORY_CARE_PROVIDER_SITE_OTHER): Payer: 59 | Admitting: Nurse Practitioner

## 2022-03-13 ENCOUNTER — Encounter: Payer: Self-pay | Admitting: Nurse Practitioner

## 2022-03-13 VITALS — BP 132/84 | HR 78 | Temp 98.0°F | Ht 65.5 in | Wt 180.8 lb

## 2022-03-13 DIAGNOSIS — F419 Anxiety disorder, unspecified: Secondary | ICD-10-CM

## 2022-03-13 DIAGNOSIS — Z23 Encounter for immunization: Secondary | ICD-10-CM | POA: Diagnosis not present

## 2022-03-13 MED ORDER — VENLAFAXINE HCL ER 37.5 MG PO CP24: 37.5000 mg | ORAL_CAPSULE | Freq: Every day | ORAL | 1 refills | Status: DC

## 2022-03-13 NOTE — Progress Notes (Unsigned)
   Established Patient Office Visit  Subjective   Patient ID: Shelby Eaton, female    DOB: 1970/05/16  Age: 52 y.o. MRN: [id number]  Chief Complaint  Patient presents with   Medical Management of Chronic Issues    F/U with starting Effexor, shingles vaccine    HPI  Shelby Eaton is here to follow-up on anxiety. Last visit she was started on venlafaxine 37.6m daily.   {History (Optional):23778}  ROS    Objective:     BP (!) 120/94 (BP Location: Right Arm)   Pulse 78   Temp 98 F (36.7 C)   Ht 5' 5.5" (1.664 m)   Wt 180 lb 12.8 oz (82 kg)   SpO2 98%   BMI 29.63 kg/m  {Vitals History (Optional):23777}  Physical Exam   No results found for any visits on 03/13/22.  {Labs (Optional):23779}  The ASCVD Risk score (Arnett DK, et al., 2019) failed to calculate for the following reasons:   Cannot find a previous HDL lab   Cannot find a previous total cholesterol lab    Assessment & Plan:   Problem List Items Addressed This Visit   None   No follow-ups on file.    LCharyl Dancer NP

## 2022-03-13 NOTE — Patient Instructions (Signed)
It was great to see you!  Keep taking the effexor daily.   Try to limit the amount of food in your diet.   Let's follow-up in 3-6 months, sooner if you have concerns.  If a referral was placed today, you will be contacted for an appointment. Please note that routine referrals can sometimes take up to 3-4 weeks to process. Please call our office if you haven't heard anything after this time frame.  Take care,  Vance Peper, NP

## 2022-03-14 ENCOUNTER — Encounter: Payer: Self-pay | Admitting: Nurse Practitioner

## 2022-03-14 NOTE — Assessment & Plan Note (Signed)
Chronic, stable.  She states that the Effexor has really helped with her symptoms.  Her PHQ-9 has improved from a 9 to a 3 and her GAD-7 has gone from a 15 to a 4.  She denies SI/HI.  Continue venlafaxine 37.5 mg daily.  Refill sent to the pharmacy.  Follow-up in 6 months.

## 2022-03-23 ENCOUNTER — Encounter: Payer: Self-pay | Admitting: Nurse Practitioner

## 2022-07-03 NOTE — Progress Notes (Signed)
CARDIOLOGY OFFICE NOTE  Date:  07/17/2022    Shelby Eaton Date of Birth: 07-23-1970 Medical Record #[id number]  PCP:  Gerre Scull, NP  Cardiologist:  Eden Emms   History of Present Illness: Shelby Eaton is a 52 y.o. female who presents today for a follow up visit.    She has a history of bicuspid aortic valve with severe aortic stenosis. On 02/28/12 she underwent aortic valve replacement at [facility] by Dr.Glower. She did well. She has a bovine bioprosthetic valve. Echo done 10/2019  showed mean gradient decreased from to 20 mmHg no PVL and DVI 0.32.  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 and needs to use more   TTE 11/13/19 mean gradient 20 peak 36.7 DVI 0.32  TTE 01/26/22 mean gradient 17 peak 31.6 DVI 0.39  AVA 0.89 cm2 TTE 07/17/2022 mean gradient 18 peak 33 mmHg   No symptoms Teeth in good shape Been walking more and weight is down    Past Medical History:  Diagnosis Date   Anxiety 2014   Aortic valve insufficiency, congenital    Endometrial polyp    Murmur    Severe aortic valve stenosis CONGENITAL --  CARDIOLOGIST -- DR BRACKBILL-- LOV IN EPIC    Vaginal cyst     Past Surgical History:  Procedure Laterality Date   CARDIAC VALVE REPLACEMENT  2014   Fixed congenital aortic valve issues   TRANSTHORACIC ECHOCARDIOGRAM  12-18-2007  DR BRACKBILL   NORMAL LVSF AND DF/ EF 55-60%/ SEVERE CALCIFIC AORTIC STENOSIS W/ MODERATE - SEVERE  AORTIC INSUFFICIENCY/ MILD MITRAL SCLEROSIS/ NORMAL PULMONARY ARTERY PRESSURE / NO SIGNIFICANT CHANGE  SINCE LAST ECHO 06-14-2006     Medications: Current Meds  Medication Sig   aspirin 81 MG tablet Take 81 mg by mouth daily.   levonorgestrel (MIRENA, 52 MG,) 20 MCG/24HR IUD Mirena 20 mcg/24 hours (5 yrs) 52 mg intrauterine device  Take 1 device every day by intrauterine route.   valACYclovir (VALTREX) 1000 MG tablet Take 1 tablet by mouth as needed (breakouts).    venlafaxine XR (EFFEXOR XR) 37.5 MG 24 hr capsule Take 1 capsule (37.5 mg total) by mouth daily with breakfast.     Allergies: No Known Allergies  Social History: The patient  reports that she quit smoking about 10 years ago. Her smoking use included cigarettes and e-cigarettes. She has never used smokeless tobacco. She reports current alcohol use of about 2.0 standard drinks of alcohol per week. She reports that she does not use drugs.   Family History: The patient's family history includes Cancer in her maternal aunt; Diabetes in her father and mother; Heart disease in her father and mother; Hypertension in her father; Stroke in her father and mother.   Review of Systems: Please see the history of present illness.   All other systems are reviewed and negative.   Physical Exam: VS:  BP 126/88   Pulse 60   Ht 5\' 5"  (1.651 m)   Wt 180 lb (81.6 kg)  SpO2 99%   BMI 29.95 kg/m  .  BMI Body mass index is 29.95 kg/m.  Wt Readings from Last 3 Encounters:  07/17/22 180 lb (81.6 kg)  07/05/22 179 lb 9.6 oz (81.5 kg)  03/13/22 180 lb 12.8 oz (82 kg)    Affect appropriate Healthy:  appears stated age HEENT: normal Neck supple with no adenopathy JVP normal no bruits no thyromegaly Lungs clear with no wheezing and good diaphragmatic motion Heart:  S1/S2 SEM thorught AVR no AR murmur, no rub, gallop or click PMI normal post sternotomy  Abdomen: benighn, BS positve, no tenderness, no AAA no bruit.  No HSM or HJR Distal pulses intact with no bruits No edema Neuro non-focal Skin warm and dry No muscular  weakness    LABORATORY DATA:  EKG:  SR limb lead reversal IVCE poor R wave progression   Lab Results  Component Value Date   WBC 8.5 11/11/2019   HGB 13.6 11/11/2019   HCT 41.8 11/11/2019   PLT 258 11/11/2019   GLUCOSE 111 (H) 11/11/2019   CHOL 171 11/03/2015   TRIG 118.0 11/03/2015   HDL 59.10 11/03/2015   LDLCALC 88 11/03/2015   ALT 22 11/11/2019   AST 16 11/11/2019   NA 138 11/11/2019   K 4.9 11/11/2019   CL 102 11/11/2019   CREATININE 0.87 11/11/2019   BUN 13 11/11/2019   CO2 24 11/11/2019   TSH 2.08 11/03/2015   HGBA1C 5.7 11/03/2015     BNP (last 3 results) No results for input(s): "BNP" in the last 8760 hours.  ProBNP (last 3 results) No results for input(s): "PROBNP" in the last 8760 hours.   Other Studies Reviewed Today:  CORONARY CT IMPRESSION 10/2019: 1. Calcium score 0   2.  Normal right dominant coronary arteries   3. Normal appearing 21 mm CE pericardial tissue valve with no HALT/HAM   4. Upper limites normal ascending thoracic aorta 3.8 cm with normal arch vessels and no dissection/hematoma.   Charlton Haws      ECHO IMPRESSIONS 10/2018   1. Left ventricular ejection fraction, by visual estimation, is 60 to  65%. The left ventricle has normal function. Normal left ventricular size.  There is no left ventricular hypertrophy.   2. Left ventricular diastolic Doppler parameters are indeterminate  pattern of LV diastolic filling.   3. Global right ventricle has normal systolic function.The right  ventricular size is normal. No increase in right ventricular wall  thickness.   4. Left atrial size was normal.   5. Right atrial size was normal.   6. The mitral valve is normal in structure. No evidence of mitral valve  regurgitation. No evidence of mitral stenosis.   7. The tricuspid valve is normal in structure. Tricuspid valve  regurgitation was not visualized by color flow Doppler.   8. Aortic valve regurgitation was not visualized by  color flow Doppler.  Mild to moderate aortic valve stenosis.   9. The pulmonic valve was normal in structure. Pulmonic valve  regurgitation is not visualized by color flow Doppler.  10. Normal pulmonary artery systolic pressure.  11. The inferior vena cava is normal in size with greater than 50%  respiratory variability, suggesting right atrial pressure of 3 mmHg.  12. S/P 21 mm bioprosthetic aortic valve, which is functioning normally.  Gradients/DI similar to prior study.     ASSESSMENT AND PLAN:   1. Prior AVR - Gradient up on TTE 01/26/22 mean 17 peak 31.6 mmHg AVA 0.89 cm2  DVI 0.39 CTA  11/13/19 showed no HALT/HAM on 21 mm CE stented pericaridal tissue valve stable gradient by TTE 07/17/2022   2. Chest pain - normal coronary C 11/13/19 T - favor GI component  Aortic root only 3.8 cm  Very atypical non exertional ? Related to anxiety no need for stress testing   3. HTN - does not like beta blockers  .    4. Obesity - very motivated to make changes.   5. Anxiety: started on Effexor   Current medicines are reviewed with the patient today.  The patient does not have concerns regarding medicines other than what has been noted above.  The following changes have been made:  See above.  Labs/ tests ordered today include:   No orders of the defined types were placed in this encounter.  TTE  June  2026   Disposition:   F/U in a year    Patient is agreeable to this plan and will call if any problems develop in the interim.   Signed: Charlton Haws, MD  07/17/2022 9:40 AM  [facility] [address] [place], [place]  [address] Phone: [phone] Fax: [phone]

## 2022-07-05 ENCOUNTER — Encounter: Payer: Self-pay | Admitting: Nurse Practitioner

## 2022-07-05 ENCOUNTER — Ambulatory Visit (INDEPENDENT_AMBULATORY_CARE_PROVIDER_SITE_OTHER): Payer: 59 | Admitting: Nurse Practitioner

## 2022-07-05 VITALS — BP 122/88 | HR 88 | Temp 97.8°F | Ht 65.5 in | Wt 179.6 lb

## 2022-07-05 DIAGNOSIS — H00012 Hordeolum externum right lower eyelid: Secondary | ICD-10-CM

## 2022-07-05 MED ORDER — AMOXICILLIN-POT CLAVULANATE 875-125 MG PO TABS: 1.0000 | ORAL_TABLET | Freq: Two times a day (BID) | ORAL | 0 refills | Status: DC

## 2022-07-05 NOTE — Progress Notes (Signed)
Acute Office Visit  Subjective:     Patient ID: Shelby Eaton, female    DOB: 10/22/1970, 52 y.o.   MRN: [id number]  Chief Complaint  Patient presents with   Eye Problem    Started out as a Stye, now pain in spreading towards the ear since Monday    HPI: Patient is in today for complaints of a Stye on the right lower eye lid. Onset was a week ago. She states she started to have pain/ tenderness radiating towards ear and down right side of face at the submandibular lymph node. This discomfort started 2 days ago. She  states she has not been around any sick contacts or anyone with similar symptoms. She has not taken any over-the- counter medication for relief. She has applied warm compresses to her eye to help with the discomfort. She states her eye is itchy at times, it drains and its matted when she wakes up in the morning and occasionally drains in the afternoons. She describes the drainage as thick and white. She denies Fever, cough and sore throat however she states she has some sinus stuffiness and puffiness. She wondering if it could be a sinus infection starting to develop. She is preparing to go on vacation to the beach and wanted to address this since it appears to be worsening.  Review of Systems  Constitutional:  Negative for chills.  HENT:  Positive for sinus pain.        Feels puffy in maxillary area Right side more than left.   Eyes:  Positive for pain, discharge and redness.  Respiratory:  Negative for cough and sputum production.   Cardiovascular:  Negative for chest pain and palpitations.  Gastrointestinal:  Negative for nausea and vomiting.  Musculoskeletal: Negative.   Skin: Negative.   Neurological:  Negative for headaches.  Psychiatric/Behavioral:  The patient is not nervous/anxious.         Objective:    BP 122/88 (BP Location: Left Arm)   Pulse 88   Temp 97.8 F (36.6 C)   Ht 5' 5.5" (1.664 m)   Wt 179 lb 9.6 oz (81.5 kg)   SpO2 99%   BMI 29.43 kg/m     Physical Exam Constitutional:      General: She is not in acute distress.    Appearance: Normal appearance.  HENT:     Head: Normocephalic and atraumatic.     Right Ear: Tympanic membrane and ear canal normal.     Left Ear: Tympanic membrane and ear canal normal.     Nose: Nose normal.     Mouth/Throat:     Mouth: Mucous membranes are moist.     Pharynx: No oropharyngeal exudate or posterior oropharyngeal erythema.  Eyes:     General:        Right eye: No discharge.     Comments: Conjunctivae of right eye is red with a stye located on the right lower eyelid.  Its a single pinpoint size white pimple thats base is erythematous and mildly swollen.   Cardiovascular:     Rate and Rhythm: Normal rate and regular rhythm.     Pulses: Normal pulses.     Heart sounds: Normal heart sounds. No murmur heard.    No friction rub. No gallop.  Pulmonary:     Effort: Pulmonary effort is normal. No respiratory distress.     Breath sounds: Normal breath sounds. No rhonchi.  Musculoskeletal:        General: Normal  range of motion.     Cervical back: No tenderness.  Lymphadenopathy:     Head:     Right side of head: Submandibular adenopathy present.     Left side of head: No submandibular or preauricular adenopathy.     Cervical: No cervical adenopathy.  Skin:    General: Skin is warm and dry.  Neurological:     Mental Status: She is alert and oriented to person, place, and time.  Psychiatric:        Mood and Affect: Mood normal.        Behavior: Behavior normal.        Thought Content: Thought content normal.        Judgment: Judgment normal.     No results found for any visits on 07/05/22.      Assessment & Plan:   Problem List Items Addressed This Visit       Other   Hordeolum externum of right lower eyelid - Primary    Patient presents with erythematous right lower eyelid. Differential early cellulitis versus Lymph node swelling secondary to radiating pain from the right  eye down to submandibular lymph node. Prescribed Augmentin 875-125 mg 1 tablet twice daily. Encourage good hand hygiene, changing out any old makeup brushes and eye liners, wash your face and eye daily. Continue to apply warm compresses to the eye. Please contact office if symptoms do not improve.        Meds ordered this encounter  Medications   amoxicillin-clavulanate (AUGMENTIN) 875-125 MG tablet    Sig: Take 1 tablet by mouth 2 (two) times daily.    Dispense:  20 tablet    Refill:  0    Return if symptoms worsen or fail to improve.  Bishop Dublin, RN

## 2022-07-05 NOTE — Assessment & Plan Note (Addendum)
Patient presents with erythematous right lower eyelid. Differential early cellulitis versus Lymph node swelling secondary to radiating pain from the right eye down to submandibular lymph node. Prescribed Augmentin 875-125 mg 1 tablet twice daily. Encourage good hand hygiene, changing out any old makeup brushes and eye liners, wash your face and eye daily. Continue to apply warm compresses to the eye. Please contact office if symptoms do not improve.

## 2022-07-05 NOTE — Patient Instructions (Signed)
It was great to see you!  Start augmetin twice a day for 10 days.   Continue using warm compresses for 10 minutes at time.   Let's follow-up if your symptoms worsen or don't improve   Take care,  Rodman Pickle, NP

## 2022-07-06 ENCOUNTER — Other Ambulatory Visit (HOSPITAL_COMMUNITY): Payer: 59

## 2022-07-17 ENCOUNTER — Ambulatory Visit (HOSPITAL_COMMUNITY): Payer: 59 | Attending: Cardiovascular Disease | Admitting: Cardiovascular Disease

## 2022-07-17 ENCOUNTER — Ambulatory Visit: Payer: 59 | Admitting: Cardiovascular Disease

## 2022-07-17 ENCOUNTER — Encounter: Payer: Self-pay | Admitting: Cardiovascular Disease

## 2022-07-17 ENCOUNTER — Ambulatory Visit (HOSPITAL_BASED_OUTPATIENT_CLINIC_OR_DEPARTMENT_OTHER): Payer: 59

## 2022-07-17 VITALS — BP 126/88 | HR 60 | Ht 65.0 in | Wt 180.0 lb

## 2022-07-17 DIAGNOSIS — I350 Nonrheumatic aortic (valve) stenosis: Secondary | ICD-10-CM | POA: Diagnosis present

## 2022-07-17 DIAGNOSIS — Z952 Presence of prosthetic heart valve: Secondary | ICD-10-CM | POA: Insufficient documentation

## 2022-07-17 DIAGNOSIS — Z953 Presence of xenogenic heart valve: Secondary | ICD-10-CM | POA: Diagnosis present

## 2022-07-17 DIAGNOSIS — I10 Essential (primary) hypertension: Secondary | ICD-10-CM | POA: Diagnosis present

## 2022-07-17 LAB — ECHOCARDIOGRAM COMPLETE
AV Mean grad: 20 mmHg
AV Peak grad: 32.4 mmHg
Ao pk vel: 2.84 m/s
Area-P 1/2: 3.66 cm2
S' Lateral: 2.3 cm

## 2022-07-17 MED ORDER — PERFLUTREN LIPID MICROSPHERE
1.0000 mL | INTRAVENOUS | Status: AC | PRN
Start: 2022-07-17 — End: 2022-07-17
  Administered 2022-07-17: 2 mL via INTRAVENOUS

## 2022-07-17 NOTE — Patient Instructions (Addendum)
Medication Instructions:  Your physician recommends that you continue on your current medications as directed. Please refer to the Current Medication list given to you today.  *If you need a refill on your cardiac medications before your next appointment, please call your pharmacy*  Lab Work: If you have labs (blood work) drawn today and your tests are completely normal, you will receive your results only by: MyChart Message (if you have MyChart) OR A paper copy in the mail If you have any lab test that is abnormal or we need to change your treatment, we will call you to review the results.  Testing/Procedures: None ordered today.  Follow-Up: At [facility], you and your health needs are our priority.  As part of our continuing mission to provide you with exceptional heart care, we have created designated Provider Care Teams.  These Care Teams include your primary Cardiologist (physician) and Advanced Practice Providers (APPs -  Physician Assistants and Nurse Practitioners) who all work together to provide you with the care you need, when you need it.  We recommend signing up for the patient portal called "MyChart".  Sign up information is provided on this After Visit Summary.  MyChart is used to connect with patients for Virtual Visits (Telemedicine).  Patients are able to view lab/test results, encounter notes, upcoming appointments, etc.  Non-urgent messages can be sent to your provider as well.   To learn more about what you can do with MyChart, go to [email or web address].    Your next appointment:   12 month(s)  Provider:   Peter Nishan, MD     

## 2022-08-04 ENCOUNTER — Encounter: Payer: Self-pay | Admitting: Nurse Practitioner

## 2022-08-04 ENCOUNTER — Ambulatory Visit (INDEPENDENT_AMBULATORY_CARE_PROVIDER_SITE_OTHER): Payer: 59 | Admitting: Nurse Practitioner

## 2022-08-04 VITALS — BP 118/78 | HR 66 | Temp 98.1°F | Ht 65.0 in | Wt 177.8 lb

## 2022-08-04 DIAGNOSIS — Z136 Encounter for screening for cardiovascular disorders: Secondary | ICD-10-CM | POA: Diagnosis not present

## 2022-08-04 DIAGNOSIS — R7301 Impaired fasting glucose: Secondary | ICD-10-CM | POA: Diagnosis not present

## 2022-08-04 DIAGNOSIS — F419 Anxiety disorder, unspecified: Secondary | ICD-10-CM | POA: Diagnosis not present

## 2022-08-04 DIAGNOSIS — Z23 Encounter for immunization: Secondary | ICD-10-CM

## 2022-08-04 DIAGNOSIS — Z0000 Encounter for general adult medical examination without abnormal findings: Secondary | ICD-10-CM | POA: Diagnosis not present

## 2022-08-04 DIAGNOSIS — Z953 Presence of xenogenic heart valve: Secondary | ICD-10-CM | POA: Diagnosis not present

## 2022-08-04 LAB — LIPID PANEL
Cholesterol: 206 mg/dL — ABNORMAL HIGH (ref 0–200)
HDL: 53.2 mg/dL (ref >39.00)
LDL Cholesterol: 131 mg/dL — ABNORMAL HIGH (ref 0–99)
NonHDL: 152.41
Total CHOL/HDL Ratio: 4
Triglycerides: 105 mg/dL (ref 0.0–149.0)
VLDL: 21 mg/dL (ref 0.0–40.0)

## 2022-08-04 LAB — CBC WITH DIFFERENTIAL/PLATELET
Basophils Absolute: 0.1 10*3/uL (ref 0.0–0.1)
Basophils Relative: 1.2 % (ref 0.0–3.0)
Eosinophils Absolute: 0.1 10*3/uL (ref 0.0–0.7)
Eosinophils Relative: 0.9 % (ref 0.0–5.0)
HCT: 40.9 % (ref 36.0–46.0)
Hemoglobin: 13.6 g/dL (ref 12.0–15.0)
Lymphocytes Relative: 33.8 % (ref 12.0–46.0)
Lymphs Abs: 2.4 10*3/uL (ref 0.7–4.0)
MCHC: 33.2 g/dL (ref 30.0–36.0)
MCV: 86.8 fl (ref 78.0–100.0)
Monocytes Absolute: 0.3 10*3/uL (ref 0.1–1.0)
Monocytes Relative: 4.9 % (ref 3.0–12.0)
Neutro Abs: 4.1 10*3/uL (ref 1.4–7.7)
Neutrophils Relative %: 59.2 % (ref 43.0–77.0)
Platelets: 240 10*3/uL (ref 150.0–400.0)
RBC: 4.72 Mil/uL (ref 3.87–5.11)
RDW: 13.1 % (ref 11.5–15.5)
WBC: 7 10*3/uL (ref 4.0–10.5)

## 2022-08-04 LAB — COMPREHENSIVE METABOLIC PANEL
ALT: 19 U/L (ref 0–35)
AST: 19 U/L (ref 0–37)
Albumin: 4.5 g/dL (ref 3.5–5.2)
Alkaline Phosphatase: 69 U/L (ref 39–117)
BUN: 22 mg/dL (ref 6–23)
CO2: 27 mEq/L (ref 19–32)
Calcium: 9.6 mg/dL (ref 8.4–10.5)
Chloride: 103 mEq/L (ref 96–112)
Creatinine, Ser: 0.91 mg/dL (ref 0.40–1.20)
GFR: 73.29 mL/min (ref >60.00)
Glucose, Bld: 99 mg/dL (ref 70–99)
Potassium: 5 mEq/L (ref 3.5–5.1)
Sodium: 138 mEq/L (ref 135–145)
Total Bilirubin: 0.3 mg/dL (ref 0.2–1.2)
Total Protein: 7.1 g/dL (ref 6.0–8.3)

## 2022-08-04 LAB — TSH: TSH: 2.09 u[IU]/mL (ref 0.35–5.50)

## 2022-08-04 LAB — HEMOGLOBIN A1C: Hgb A1c MFr Bld: 5.7 % (ref 4.6–6.5)

## 2022-08-04 NOTE — Progress Notes (Signed)
BP 118/78 (BP Location: Left Arm)   Pulse 66   Temp 98.1 F (36.7 C)   Ht 5\' 5"  (1.651 m)   Wt 177 lb 12.8 oz (80.6 kg)   SpO2 98%   BMI 29.59 kg/m    Subjective:    Patient ID: Shelby Eaton, female    DOB: May 05, 1970, 52 y.o.   MRN: [id number]  CC: Chief Complaint  Patient presents with   Annual Exam    With fasting labs, 2nd Shingles Vaccine    HPI: Shelby Eaton is a 52 y.o. female presenting on 08/04/2022 for comprehensive medical examination. Current medical complaints include: none  She currently lives with: alone Menopausal Symptoms: no  Depression and Anxiety Screen done today and results listed below:     08/04/2022    8:41 AM 03/13/2022    3:05 PM 02/10/2022   11:45 AM  Depression screen PHQ 2/9  Decreased Interest 0 1 1  Down, Depressed, Hopeless 0 0 0  PHQ - 2 Score 0 1 1  Altered sleeping 2 1 1   Tired, decreased energy 3 1 3   Change in appetite 0 0 1  Feeling bad or failure about yourself  0 0 0  Trouble concentrating 0 0 3  Moving slowly or fidgety/restless 0 0 0  Suicidal thoughts 0 0 0  PHQ-9 Score 5 3 9   Difficult doing work/chores Somewhat difficult Not difficult at all Somewhat difficult      08/04/2022    8:41 AM 03/13/2022    3:05 PM 02/10/2022   11:45 AM  GAD 7 : Generalized Anxiety Score  Nervous, Anxious, on Edge 0 1 3  Control/stop worrying 0 1 2  Worry too much - different things 0 1 3  Trouble relaxing 0 0 3  Restless 0 0 0  Easily annoyed or irritable 0 0 3  Afraid - awful might happen 0 1 1  Total GAD 7 Score 0 4 15  Anxiety Difficulty Not difficult at all Not difficult at all Somewhat difficult    The patient does not have a history of falls. I did not complete a risk assessment for falls. A plan of care for falls was not documented.   Past Medical History:  Past Medical History:  Diagnosis Date   Anxiety 2014   Aortic valve insufficiency, congenital    Endometrial polyp    Murmur    Severe aortic valve stenosis  CONGENITAL --  CARDIOLOGIST -- DR BRACKBILL-- LOV IN EPIC    Vaginal cyst     Surgical History:  Past Surgical History:  Procedure Laterality Date   CARDIAC VALVE REPLACEMENT  2014   Fixed congenital aortic valve issues   TRANSTHORACIC ECHOCARDIOGRAM  12-18-2007  DR BRACKBILL   NORMAL LVSF AND DF/ EF 55-60%/ SEVERE CALCIFIC AORTIC STENOSIS W/ MODERATE - SEVERE  AORTIC INSUFFICIENCY/ MILD MITRAL SCLEROSIS/ NORMAL PULMONARY ARTERY PRESSURE / NO SIGNIFICANT CHANGE SINCE LAST ECHO 06-14-2006    Medications:  Current Outpatient Medications on File Prior to Visit  Medication Sig   aspirin 81 MG tablet Take 81 mg by mouth daily.   levonorgestrel (MIRENA, 52 MG,) 20 MCG/24HR IUD Mirena 20 mcg/24 hours (5 yrs) 52 mg intrauterine device  Take 1 device every day by intrauterine route.   valACYclovir (VALTREX) 1000 MG tablet Take 1 tablet by mouth as needed (breakouts).    venlafaxine XR (EFFEXOR XR) 37.5 MG 24 hr capsule Take 1 capsule (37.5 mg total) by mouth daily with breakfast.  No current facility-administered medications on file prior to visit.    Allergies:  No Known Allergies  Social History:  Social History   Socioeconomic History   Marital status: Single    Spouse name: Not on file   Number of children: Not on file   Years of education: Not on file   Highest education level: Bachelor's degree (e.g., BA, AB, BS)  Occupational History   Not on file  Tobacco Use   Smoking status: Former    Current packs/day: 0.00    Types: Cigarettes, E-cigarettes    Start date: 08/16/1991    Quit date: 08/16/2011    Years since quitting: 10.9   Smokeless tobacco: Never   Tobacco comments:    I quit years ago...  Vaping Use   Vaping status: Never Used  Substance and Sexual Activity   Alcohol use: Yes    Alcohol/week: 2.0 standard drinks of alcohol    Types: 2 Glasses of wine per week    Comment: weekends   Drug use: No   Sexual activity: Not on file    Comment: placed 12/05/17 [place]  [facility]  Other Topics Concern   Not on file  Social History Narrative   Not on file   Social Determinants of Health   Financial Resource Strain: Low Risk  (07/04/2022)   Overall Financial Resource Strain (CARDIA)    Difficulty of Paying Living Expenses: Not very hard  Food Insecurity: No Food Insecurity (07/04/2022)   Hunger Vital Sign    Worried About Running Out of Food in the Last Year: Never true    Ran Out of Food in the Last Year: Never true  Transportation Needs: No Transportation Needs (07/04/2022)   PRAPARE - Administrator, Civil Service (Medical): No    Lack of Transportation (Non-Medical): No  Physical Activity: Sufficiently Active (07/04/2022)   Exercise Vital Sign    Days of Exercise per Week: 4 days    Minutes of Exercise per Session: 50 min  Stress: No Stress Concern Present (07/04/2022)   [employer] of Occupational Health - Occupational Stress Questionnaire    Feeling of Stress : Only a little  Social Connections: Moderately Integrated (07/04/2022)   Social Connection and Isolation Panel [NHANES]    Frequency of Communication with Friends and Family: More than three times a week    Frequency of Social Gatherings with Friends and Family: Twice a week    Attends Religious Services: More than 4 times per year    Active Member of [employer] or Organizations: Yes    Attends Engineer, structural: More than 4 times per year    Marital Status: Never married  Intimate Partner Violence: Not on file   Social History   Tobacco Use  Smoking Status Former   Current packs/day: 0.00   Types: Cigarettes, E-cigarettes   Start date: 08/16/1991   Quit date: 08/16/2011   Years since quitting: 10.9  Smokeless Tobacco Never  Tobacco Comments   I quit years ago...   Social History   Substance and Sexual Activity  Alcohol Use Yes   Alcohol/week: 2.0 standard drinks of alcohol   Types: 2 Glasses of wine per week   Comment: weekends    Family History:   Family History  Problem Relation Age of Onset   Heart disease Mother    Stroke Mother    Diabetes Mother    Heart disease Father    Stroke Father    Hypertension Father  Diabetes Father    Cancer Maternal Aunt        breast cancer    Past medical history, surgical history, medications, allergies, family history and social history reviewed with patient today and changes made to appropriate areas of the chart.   Review of Systems  Constitutional: Negative.   HENT: Negative.    Eyes: Negative.   Respiratory: Negative.    Cardiovascular: Negative.   Gastrointestinal: Negative.   Genitourinary: Negative.   Musculoskeletal: Negative.   Skin: Negative.   Neurological: Negative.   Psychiatric/Behavioral: Negative.     All other ROS negative except what is listed above and in the HPI.      Objective:    BP 118/78 (BP Location: Left Arm)   Pulse 66   Temp 98.1 F (36.7 C)   Ht 5\' 5"  (1.651 m)   Wt 177 lb 12.8 oz (80.6 kg)   SpO2 98%   BMI 29.59 kg/m   Wt Readings from Last 3 Encounters:  08/04/22 177 lb 12.8 oz (80.6 kg)  07/17/22 180 lb (81.6 kg)  07/05/22 179 lb 9.6 oz (81.5 kg)    Physical Exam Vitals and nursing note reviewed.  Constitutional:      General: She is not in acute distress.    Appearance: Normal appearance.  HENT:     Head: Normocephalic and atraumatic.     Right Ear: Tympanic membrane, ear canal and external ear normal.     Left Ear: Tympanic membrane, ear canal and external ear normal.  Eyes:     Conjunctiva/sclera: Conjunctivae normal.  Cardiovascular:     Rate and Rhythm: Normal rate and regular rhythm.     Pulses: Normal pulses.     Heart sounds: Murmur heard.  Pulmonary:     Effort: Pulmonary effort is normal.     Breath sounds: Normal breath sounds.  Abdominal:     Palpations: Abdomen is soft.     Tenderness: There is no abdominal tenderness.  Musculoskeletal:        General: Normal range of motion.     Cervical back: Normal  range of motion and neck supple.     Right lower leg: No edema.     Left lower leg: No edema.  Lymphadenopathy:     Cervical: No cervical adenopathy.  Skin:    General: Skin is warm and dry.  Neurological:     General: No focal deficit present.     Mental Status: She is alert and oriented to person, place, and time.     Cranial Nerves: No cranial nerve deficit.     Coordination: Coordination normal.     Gait: Gait normal.  Psychiatric:        Mood and Affect: Mood normal.        Behavior: Behavior normal.        Thought Content: Thought content normal.        Judgment: Judgment normal.     Results for orders placed or performed in visit on 07/17/22  ECHOCARDIOGRAM COMPLETE  Result Value Ref Range   Area-P 1/2 3.66 cm2   S' Lateral 2.30 cm   Ao pk vel 2.84 m/s   AV Mean grad 20.0 mmHg   AV Peak grad 32.4 mmHg   Est EF 60 - 65%       Assessment & Plan:   Problem List Items Addressed This Visit       Other   S/P aortic valve replacement with bioprosthetic valve  Chronic, stable.  She had her valve replaced in 2014 and has been doing well since then.  She follows with cardiology regularly.  Continue antibiotics prophylactically before dental appointments.      Anxiety    Chronic, stable.  She states that the Effexor has really helped with her symptoms. Continue effexor 37.5mg  daily. Follow-up in 1 year or sooner with concerns.       Routine general medical examination at a health care facility - Primary    Health maintenance reviewed and updated. Discussed nutrition, exercise. Check CMP, CBC, TSH today. Follow-up 1 year.        Relevant Orders   CBC with Differential/Platelet   Comprehensive metabolic panel   TSH   Other Visit Diagnoses     Screening for cardiovascular condition       Screen lipid panel today   Relevant Orders   Lipid panel   IFG (impaired fasting glucose)       Glucose elevated on prior labs, will check A1c today   Relevant Orders    Hemoglobin A1c   Immunization due       Second shingrix vaccine given today. Side effects discussed.   Relevant Orders   Varicella-zoster vaccine IM (Completed)        Follow up plan: Return in about 1 year (around 08/04/2023) for CPE.   LABORATORY TESTING:  - Pap smear: done elsewhere  IMMUNIZATIONS:   - Tdap: Tetanus vaccination status reviewed: last tetanus booster within 10 years. - Influenza: Postponed to flu season - Pneumovax: Not applicable - Prevnar: Not applicable - HPV: Not applicable - Zostavax vaccine: Up to date  SCREENING: -Mammogram: Up to date  - Colonoscopy: Up to date  - Bone Density: Not applicable   PATIENT COUNSELING:   Advised to take 1 mg of folate supplement per day if capable of pregnancy.   Sexuality: Discussed sexually transmitted diseases, partner selection, use of condoms, avoidance of unintended pregnancy  and contraceptive alternatives.   Advised to avoid cigarette smoking.  I discussed with the patient that most people either abstain from alcohol or drink within safe limits (<=14/week and <=4 drinks/occasion for males, <=7/weeks and <= 3 drinks/occasion for females) and that the risk for alcohol disorders and other health effects rises proportionally with the number of drinks per week and how often a drinker exceeds daily limits.  Discussed cessation/primary prevention of drug use and availability of treatment for abuse.   Diet: Encouraged to adjust caloric intake to maintain  or achieve ideal body weight, to reduce intake of dietary saturated fat and total fat, to limit sodium intake by avoiding high sodium foods and not adding table salt, and to maintain adequate dietary potassium and calcium preferably from fresh fruits, vegetables, and low-fat dairy products.    stressed the importance of regular exercise  Injury prevention: Discussed safety belts, safety helmets, smoke detector, smoking near bedding or upholstery.   Dental health:  Discussed importance of regular tooth brushing, flossing, and dental visits.    NEXT PREVENTATIVE PHYSICAL DUE IN 1 YEAR. Return in about 1 year (around 08/04/2023) for CPE.

## 2022-08-04 NOTE — Assessment & Plan Note (Signed)
Chronic, stable.  She had her valve replaced in 2014 and has been doing well since then.  She follows with cardiology regularly.  Continue antibiotics prophylactically before dental appointments. 

## 2022-08-04 NOTE — Patient Instructions (Signed)
It was great to see you!  We are checking your labs today and will let you know the results via mychart/phone.   Let's follow-up in 1 year, sooner if you have concerns.  If a referral was placed today, you will be contacted for an appointment. Please note that routine referrals can sometimes take up to 3-4 weeks to process. Please call our office if you haven't heard anything after this time frame.  Take care,  Deveron Shamoon, NP  

## 2022-08-04 NOTE — Assessment & Plan Note (Signed)
Chronic, stable.  She states that the Effexor has really helped with her symptoms. Continue effexor 37.5mg  daily. Follow-up in 1 year or sooner with concerns.

## 2022-08-04 NOTE — Assessment & Plan Note (Signed)
Health maintenance reviewed and updated. Discussed nutrition, exercise. Check CMP, CBC, TSH today. Follow-up 1 year.   

## 2022-09-26 ENCOUNTER — Other Ambulatory Visit: Payer: Self-pay | Admitting: Nurse Practitioner

## 2022-09-26 NOTE — Telephone Encounter (Signed)
Requesting: VENLAFAXINE ER 37.5MG  CAPSULES  Last Visit: 08/04/2022 Next Visit: Visit date not found Last Refill: 03/03/2022  Please Advise

## 2022-10-18 ENCOUNTER — Encounter: Payer: Self-pay | Admitting: Cardiovascular Disease

## 2022-10-18 LAB — RESULTS CONSOLE HPV: CHL HPV: NEGATIVE

## 2022-10-18 LAB — HM PAP SMEAR: HM Pap smear: NORMAL

## 2023-03-02 ENCOUNTER — Encounter: Payer: Self-pay | Admitting: Nurse Practitioner

## 2023-03-28 ENCOUNTER — Other Ambulatory Visit: Payer: Self-pay | Admitting: Nurse Practitioner

## 2023-03-29 NOTE — Telephone Encounter (Signed)
 Requesting: VENLAFAXINE ER 37.5MG  CAPSULES  Last Visit: 08/04/2022 Next Visit: Visit date not found Last Refill: 09/26/2022  Please Advise

## 2023-04-30 ENCOUNTER — Other Ambulatory Visit: Payer: Self-pay

## 2023-04-30 MED ORDER — VENLAFAXINE HCL ER 37.5 MG PO CP24: 37.5000 mg | ORAL_CAPSULE | Freq: Every day | ORAL | 1 refills | Status: DC

## 2023-04-30 NOTE — Telephone Encounter (Signed)
 Requesting: Venlafaxine HCL ER CAPS 37.5mg  90 Day supply Last Visit: 08/04/2022 Next Visit: Visit date not found Last Refill: 03/29/2023  Please Advise

## 2023-07-08 ENCOUNTER — Ambulatory Visit
Admission: RE | Admit: 2023-07-08 | Discharge: 2023-07-08 | Disposition: A | Discharge status: Home / Self Care | Source: Ambulatory Visit | Attending: Physician Assistant | Admitting: Physician Assistant

## 2023-07-08 ENCOUNTER — Other Ambulatory Visit: Payer: Self-pay

## 2023-07-08 VITALS — BP 138/89 | HR 63 | Temp 98.2°F | Resp 18 | Ht 65.0 in | Wt 180.0 lb

## 2023-07-08 DIAGNOSIS — R42 Dizziness and giddiness: Secondary | ICD-10-CM | POA: Diagnosis not present

## 2023-07-08 DIAGNOSIS — H6993 Unspecified Eustachian tube disorder, bilateral: Secondary | ICD-10-CM | POA: Diagnosis not present

## 2023-07-08 DIAGNOSIS — R0981 Nasal congestion: Secondary | ICD-10-CM | POA: Diagnosis not present

## 2023-07-08 MED ORDER — SCOPOLAMINE 1 MG/3DAYS TD PT72
1.0000 | MEDICATED_PATCH | TRANSDERMAL | 0 refills | Status: AC
Start: 2023-07-08 — End: ?

## 2023-07-08 MED ORDER — ONDANSETRON 4 MG PO TBDP
4.0000 mg | ORAL_TABLET | Freq: Three times a day (TID) | ORAL | 0 refills | Status: AC | PRN
Start: 2023-07-08 — End: ?

## 2023-07-08 NOTE — Discharge Instructions (Addendum)
 You were seen today for concerns of ear pressure as well as dizziness.  At this time you do not have signs of an acute ear infection but I do suspect you have some eustachian tube dysfunction which is causing your symptoms.  I have included information about this in your paperwork for you to review and I have a few recommendations to help with managing your symptoms.  I recommend starting a second-generation antihistamine such as Claritin, Allegra, or Zyrtec per your preference.  This should help reduce some of the sinus pressure and congestion which is likely preventing her eustachian tubes from drinking appropriately. In addition to the second-generation antihistamine I recommend using Flonase twice per day to assist with nasal congestion and sinus pressure.  These medications can take a few weeks to reach full therapeutic effect but you should start to have some relief from the first 7 to 10 days.  To help with your dizziness I sent in a motion sickness patch called scopolamine.  You can apply this behind your ear and leave the patch on for 72 hours as needed to help with dizziness and motion sickness. You can reapply one as needed up to every 72 hours.   I have also sent in a medication called Zofran  to assist with your nausea and vomiting.  You can take this as needed up to every 8 hours.  Please be advised that the most common side effect of this medication is constipation.  If you start to develop the symptoms I recommend taking a few doses of a stool softener and increasing your fiber.  If needed you can also take a few doses of MiraLAX until you have regular bowel movements again.

## 2023-07-08 NOTE — ED Provider Notes (Signed)
 [facility]    CSN: [id number] Arrival date & time: 07/08/23  1317      History   Chief Complaint Chief Complaint  Patient presents with   Ear Fullness    Inner ear infection causing Vertigo.Very dizzy and nauseous - Entered by patient    HPI Shelby Eaton is a 53 y.o. female.   HPI  Pt reports concerns for potential ear infection  She states her ears have felt clogged since Wed and she has had vertigo and dizziness since this started She denies pain but states her hearing is muffled and it feels like there is a lot of pressure there   She reports she did have fever and chills about weeks ago and feels like her nasal congestion has improved but she still has sinus pressure and pain     Past Medical History:  Diagnosis Date   Anxiety 2014   Aortic valve insufficiency, congenital    Endometrial polyp    Murmur    Severe aortic valve stenosis CONGENITAL --  CARDIOLOGIST -- DR BRACKBILL-- LOV IN EPIC    Vaginal cyst     Patient Active Problem List   Diagnosis Date Noted   Routine general medical examination at a health care facility 08/04/2022   Hordeolum externum of right lower eyelid 07/05/2022   Anxiety 12/17/2013   Feeling of chest tightness 04/28/2013   S/P aortic valve replacement with bioprosthetic valve 04/08/2012   IVCD (intraventricular conduction defect) 04/08/2012   Aortic stenosis 10/24/2011    Past Surgical History:  Procedure Laterality Date   CARDIAC VALVE REPLACEMENT  2014   Fixed congenital aortic valve issues   TRANSTHORACIC ECHOCARDIOGRAM  12-18-2007  DR BRACKBILL   NORMAL LVSF AND DF/ EF 55-60%/ SEVERE CALCIFIC AORTIC STENOSIS W/ MODERATE - SEVERE  AORTIC INSUFFICIENCY/ MILD MITRAL SCLEROSIS/ NORMAL PULMONARY ARTERY PRESSURE / NO SIGNIFICANT CHANGE SINCE LAST ECHO 06-14-2006    OB History   No obstetric history on file.      Home Medications    Prior to Admission medications   Medication Sig Start Date End Date Taking?  Authorizing Provider  ondansetron  (ZOFRAN -ODT) 4 MG disintegrating tablet Take 1 tablet (4 mg total) by mouth every 8 (eight) hours as needed for nausea or vomiting. 07/08/23  Yes Brenya Taulbee E, PA-C  scopolamine (TRANSDERM-SCOP) 1 MG/3DAYS Place 1 patch (1.5 mg total) onto the skin every 3 (three) days. 07/08/23  Yes Cattie Tineo E, PA-C  aspirin 81 MG tablet Take 81 mg by mouth daily.    [provider]  levonorgestrel (MIRENA, 52 MG,) 20 MCG/24HR IUD Mirena 20 mcg/24 hours (5 yrs) 52 mg intrauterine device  Take 1 device every day by intrauterine route.    [provider]  valACYclovir (VALTREX) 1000 MG tablet Take 1 tablet by mouth as needed (breakouts).     [provider]  venlafaxine  XR (EFFEXOR -XR) 37.5 MG 24 hr capsule Take 1 capsule (37.5 mg total) by mouth daily with breakfast. 04/30/23   McElwee, Adolfo Hooker, NP    Family History Family History  Problem Relation Age of Onset   Heart disease Mother    Stroke Mother    Diabetes Mother    Heart disease Father    Stroke Father    Hypertension Father    Diabetes Father    Cancer Maternal Aunt        breast cancer    Social History Social History   Tobacco Use   Smoking status: Former  Current packs/day: 0.00    Types: Cigarettes, E-cigarettes    Start date: 08/16/1991    Quit date: 08/16/2011    Years since quitting: 11.9   Smokeless tobacco: Never   Tobacco comments:    I quit years ago...  Vaping Use   Vaping status: Never Used  Substance Use Topics   Alcohol use: Yes    Alcohol/week: 2.0 standard drinks of alcohol    Types: 2 Glasses of wine per week    Comment: weekends   Drug use: No     Allergies   Patient has no known allergies.   Review of Systems Review of Systems  Constitutional:  Negative for chills and fever.  HENT:  Positive for sinus pressure and sinus pain. Negative for ear discharge, ear pain and sore throat.   Gastrointestinal:  Positive for nausea. Negative for  diarrhea and vomiting.  Neurological:  Positive for dizziness and light-headedness.     Physical Exam Triage Vital Signs ED Triage Vitals  Encounter Vitals Group     BP 07/08/23 1335 138/89     Girls Systolic BP Percentile --      Girls Diastolic BP Percentile --      Boys Systolic BP Percentile --      Boys Diastolic BP Percentile --      Pulse Rate 07/08/23 1335 63     Resp 07/08/23 1335 18     Temp 07/08/23 1335 98.2 F (36.8 C)     Temp Source 07/08/23 1335 Oral     SpO2 07/08/23 1335 98 %     Weight 07/08/23 1339 180 lb (81.6 kg)     Height 07/08/23 1339 5' 5 (1.651 m)     Head Circumference --      Peak Flow --      Pain Score 07/08/23 1339 0     Pain Loc --      Pain Education --      Exclude from Growth Chart --    No data found.  Updated Vital Signs BP 138/89 (BP Location: Right Arm)   Pulse 63   Temp 98.2 F (36.8 C) (Oral)   Resp 18   Ht 5' 5 (1.651 m)   Wt 180 lb (81.6 kg)   LMP  (Within Months)   SpO2 98%   BMI 29.95 kg/m   Visual Acuity Right Eye Distance:   Left Eye Distance:   Bilateral Distance:    Right Eye Near:   Left Eye Near:    Bilateral Near:     Physical Exam Vitals reviewed.  Constitutional:      General: She is awake.     Appearance: Normal appearance. She is well-developed and well-groomed.  HENT:     Head: Normocephalic and atraumatic.     Right Ear: Hearing, tympanic membrane and ear canal normal.     Left Ear: Hearing, tympanic membrane and ear canal normal.     Nose:     Right Sinus: Maxillary sinus tenderness present.     Left Sinus: Maxillary sinus tenderness present.     Mouth/Throat:     Lips: Pink.     Mouth: Mucous membranes are moist.     Pharynx: Oropharynx is clear. Uvula midline. No pharyngeal swelling, oropharyngeal exudate, posterior oropharyngeal erythema, uvula swelling or postnasal drip.     Tonsils: No tonsillar exudate or tonsillar abscesses.   Eyes:     General: Lids are normal. Gaze aligned  appropriately.     Extraocular  Movements: Extraocular movements intact.     Conjunctiva/sclera: Conjunctivae normal.   Pulmonary:     Effort: Pulmonary effort is normal.   Musculoskeletal:     Cervical back: Normal range of motion and neck supple.  Lymphadenopathy:     Head:     Right side of head: No submental, submandibular or preauricular adenopathy.     Left side of head: No submental, submandibular or preauricular adenopathy.     Cervical:     Right cervical: No superficial cervical adenopathy.    Left cervical: No superficial cervical adenopathy.     Upper Body:     Right upper body: No supraclavicular adenopathy.     Left upper body: No supraclavicular adenopathy.   Neurological:     General: No focal deficit present.     Mental Status: She is alert and oriented to person, place, and time.     GCS: GCS eye subscore is 4. GCS verbal subscore is 5. GCS motor subscore is 6.     Cranial Nerves: No cranial nerve deficit, dysarthria or facial asymmetry.   Psychiatric:        Attention and Perception: Attention and perception normal.        Mood and Affect: Mood and affect normal.        Speech: Speech normal.        Behavior: Behavior normal. Behavior is cooperative.      UC Treatments / Results  Labs (all labs ordered are listed, but only abnormal results are displayed) Labs Reviewed - No data to display  EKG   Radiology No results found.  Procedures Procedures (including critical care time)  Medications Ordered in UC Medications - No data to display  Initial Impression / Assessment and Plan / UC Course  I have reviewed the triage vital signs and the nursing notes.  Pertinent labs & imaging results that were available during my care of the patient were reviewed by me and considered in my medical decision making (see chart for details).      Final Clinical Impressions(s) / UC Diagnoses   Final diagnoses:  Eustachian tube dysfunction, bilateral  Sinus  congestion  Dizziness and giddiness   Patient presenting with concerns for ear fullness, dizziness that has been ongoing since Wednesday.  She reports minimal relief with home measures.  Physical exam and vitals are overall reassuring at this time.  At this time I suspect likely eustachian tube dysfunction secondary to sinus congestion.  Recommend starting second-generation antihistamine as well as Flonase to assist with congestion pressure.  Will send in Zofran  to assist with nausea secondary to dizziness.  Will send in scopolamine patches to assist with dizziness.  Follow-up as needed for progressing or persistent symptoms    Discharge Instructions      You were seen today for concerns of ear pressure as well as dizziness.  At this time you do not have signs of an acute ear infection but I do suspect you have some eustachian tube dysfunction which is causing your symptoms.  I have included information about this in your paperwork for you to review and I have a few recommendations to help with managing your symptoms.  I recommend starting a second-generation antihistamine such as Claritin, Allegra, or Zyrtec per your preference.  This should help reduce some of the sinus pressure and congestion which is likely preventing her eustachian tubes from drinking appropriately. In addition to the second-generation antihistamine I recommend using Flonase twice per day to assist  with nasal congestion and sinus pressure.  These medications can take a few weeks to reach full therapeutic effect but you should start to have some relief from the first 7 to 10 days.  To help with your dizziness I sent in a motion sickness patch called scopolamine.  You can apply this behind your ear and leave the patch on for 72 hours as needed to help with dizziness and motion sickness. You can reapply one as needed up to every 72 hours.   I have also sent in a medication called Zofran  to assist with your nausea and vomiting.   You can take this as needed up to every 8 hours.  Please be advised that the most common side effect of this medication is constipation.  If you start to develop the symptoms I recommend taking a few doses of a stool softener and increasing your fiber.  If needed you can also take a few doses of MiraLAX until you have regular bowel movements again.     ED Prescriptions     Medication Sig Dispense Auth. Provider   scopolamine (TRANSDERM-SCOP) 1 MG/3DAYS Place 1 patch (1.5 mg total) onto the skin every 3 (three) days. 10 patch Dev Dhondt E, PA-C   ondansetron  (ZOFRAN -ODT) 4 MG disintegrating tablet Take 1 tablet (4 mg total) by mouth every 8 (eight) hours as needed for nausea or vomiting. 20 tablet Chailyn Racette E, PA-C      PDMP not reviewed this encounter.   Jerona Mooring, PA-C 07/08/23 1617

## 2023-07-08 NOTE — ED Triage Notes (Signed)
 Pt presents with complaints of left ear fullness and popping x 5 days. Pt states she feels like she is on an airplane with all the pressure. Also reports dizziness + nausea as well. Pt currently denies pain. OTC decongestants taken with little relief.

## 2023-09-14 ENCOUNTER — Encounter: Payer: Self-pay | Admitting: Nurse Practitioner

## 2023-09-14 ENCOUNTER — Ambulatory Visit (INDEPENDENT_AMBULATORY_CARE_PROVIDER_SITE_OTHER): Admitting: Nurse Practitioner

## 2023-09-14 ENCOUNTER — Ambulatory Visit: Payer: Self-pay | Admitting: Nurse Practitioner

## 2023-09-14 VITALS — BP 118/80 | HR 59 | Temp 97.2°F | Ht 65.0 in | Wt 184.2 lb

## 2023-09-14 DIAGNOSIS — D229 Melanocytic nevi, unspecified: Secondary | ICD-10-CM

## 2023-09-14 DIAGNOSIS — N951 Menopausal and female climacteric states: Secondary | ICD-10-CM | POA: Diagnosis not present

## 2023-09-14 DIAGNOSIS — Z953 Presence of xenogenic heart valve: Secondary | ICD-10-CM

## 2023-09-14 DIAGNOSIS — Z23 Encounter for immunization: Secondary | ICD-10-CM | POA: Diagnosis not present

## 2023-09-14 DIAGNOSIS — R7303 Prediabetes: Secondary | ICD-10-CM

## 2023-09-14 DIAGNOSIS — F419 Anxiety disorder, unspecified: Secondary | ICD-10-CM | POA: Diagnosis not present

## 2023-09-14 DIAGNOSIS — Z0000 Encounter for general adult medical examination without abnormal findings: Secondary | ICD-10-CM | POA: Diagnosis not present

## 2023-09-14 LAB — LIPID PANEL
Cholesterol: 193 mg/dL (ref 0–200)
HDL: 53.6 mg/dL (ref >39.00)
LDL Cholesterol: 108 mg/dL — ABNORMAL HIGH (ref 0–99)
NonHDL: 139.43
Total CHOL/HDL Ratio: 4
Triglycerides: 159 mg/dL — ABNORMAL HIGH (ref 0.0–149.0)
VLDL: 31.8 mg/dL (ref 0.0–40.0)

## 2023-09-14 LAB — COMPREHENSIVE METABOLIC PANEL WITH GFR
ALT: 24 U/L (ref 0–35)
AST: 18 U/L (ref 0–37)
Albumin: 4.5 g/dL (ref 3.5–5.2)
Alkaline Phosphatase: 62 U/L (ref 39–117)
BUN: 15 mg/dL (ref 6–23)
CO2: 24 meq/L (ref 19–32)
Calcium: 8.9 mg/dL (ref 8.4–10.5)
Chloride: 104 meq/L (ref 96–112)
Creatinine, Ser: 0.86 mg/dL (ref 0.40–1.20)
GFR: 77.82 mL/min (ref >60.00)
Glucose, Bld: 117 mg/dL — ABNORMAL HIGH (ref 70–99)
Potassium: 4.2 meq/L (ref 3.5–5.1)
Sodium: 138 meq/L (ref 135–145)
Total Bilirubin: 0.3 mg/dL (ref 0.2–1.2)
Total Protein: 7.4 g/dL (ref 6.0–8.3)

## 2023-09-14 LAB — CBC WITH DIFFERENTIAL/PLATELET
Basophils Absolute: 0.1 K/uL (ref 0.0–0.1)
Basophils Relative: 1 % (ref 0.0–3.0)
Eosinophils Absolute: 0.1 K/uL (ref 0.0–0.7)
Eosinophils Relative: 1 % (ref 0.0–5.0)
HCT: 39.7 % (ref 36.0–46.0)
Hemoglobin: 13.2 g/dL (ref 12.0–15.0)
Lymphocytes Relative: 32.8 % (ref 12.0–46.0)
Lymphs Abs: 2.6 K/uL (ref 0.7–4.0)
MCHC: 33.2 g/dL (ref 30.0–36.0)
MCV: 85.5 fl (ref 78.0–100.0)
Monocytes Absolute: 0.3 K/uL (ref 0.1–1.0)
Monocytes Relative: 3.9 % (ref 3.0–12.0)
Neutro Abs: 4.8 K/uL (ref 1.4–7.7)
Neutrophils Relative %: 61.3 % (ref 43.0–77.0)
Platelets: 259 K/uL (ref 150.0–400.0)
RBC: 4.64 Mil/uL (ref 3.87–5.11)
RDW: 13.2 % (ref 11.5–15.5)
WBC: 7.8 K/uL (ref 4.0–10.5)

## 2023-09-14 LAB — HEMOGLOBIN A1C: Hgb A1c MFr Bld: 6.2 % (ref 4.6–6.5)

## 2023-09-14 NOTE — Assessment & Plan Note (Signed)
Chronic, stable. Check A1c today and treat based on results.

## 2023-09-14 NOTE — Assessment & Plan Note (Signed)
 Chronic, stable. She has an IUD and estradiol patches. Continue collaboration and recommendations from GYN.

## 2023-09-14 NOTE — Assessment & Plan Note (Signed)
 Health maintenance reviewed and updated. Discussed nutrition, exercise. Check CMP, CBC today. Follow-up 1 year.

## 2023-09-14 NOTE — Patient Instructions (Addendum)
 It was great to see you!  We are checking your labs today and will let you know the results via mychart/phone.   Start stretching daily to help with your pain   I have placed a referral to dermatology   Let's follow-up in 1 year, sooner if you have concerns.  If a referral was placed today, you will be contacted for an appointment. Please note that routine referrals can sometimes take up to 3-4 weeks to process. Please call our office if you haven't heard anything after this time frame.  Take care,  Tinnie Harada, NP

## 2023-09-14 NOTE — Assessment & Plan Note (Signed)
 Chronic, stable.  She had her valve replaced in 2014 and has been doing well since then.  She follows with cardiology regularly.  Continue antibiotics prophylactically before dental appointments. Check lipid panel today.

## 2023-09-14 NOTE — Assessment & Plan Note (Signed)
 Chronic, stable. Continue effexor  37.5mg  daily. Follow-up in 1 year or sooner with concerns.

## 2023-09-14 NOTE — Progress Notes (Signed)
 BP 118/80 (BP Location: Left Arm, Patient Position: Sitting, Cuff Size: Normal)   Pulse (!) 59   Temp (!) 97.2 F (36.2 C)   Ht 5' 5 (1.651 m)   Wt 184 lb 3.2 oz (83.6 kg)   SpO2 99%   BMI 30.65 kg/m    Subjective:    Patient ID: Shelby Eaton, female    DOB: 10-16-1970, 53 y.o.   MRN: [id number]  CC: Chief Complaint  Patient presents with   Annual Exam    With lab work-patient is not fasting, concerns with family Hx of Diabetes    HPI: Shelby Eaton is a 53 y.o. female presenting on 09/14/2023 for comprehensive medical examination. Current medical complaints include:none  Depression and Anxiety Screen done today and results listed below:     09/14/2023    1:34 PM 08/04/2022    8:41 AM 03/13/2022    3:05 PM 02/10/2022   11:45 AM  Depression screen PHQ 2/9  Decreased Interest 0 0 1 1  Down, Depressed, Hopeless 0 0 0 0  PHQ - 2 Score 0 0 1 1  Altered sleeping 0 2 1 1   Tired, decreased energy 0 3 1 3   Change in appetite 0 0 0 1  Feeling bad or failure about yourself  0 0 0 0  Trouble concentrating 0 0 0 3  Moving slowly or fidgety/restless 0 0 0 0  Suicidal thoughts 0 0 0 0  PHQ-9 Score 0 5 3 9   Difficult doing work/chores Not difficult at all Somewhat difficult Not difficult at all Somewhat difficult      09/14/2023    1:34 PM 08/04/2022    8:41 AM 03/13/2022    3:05 PM 02/10/2022   11:45 AM  GAD 7 : Generalized Anxiety Score  Nervous, Anxious, on Edge 0 0 1 3  Control/stop worrying 0 0 1 2  Worry too much - different things 0 0 1 3  Trouble relaxing 0 0 0 3  Restless 0 0 0 0  Easily annoyed or irritable 0 0 0 3  Afraid - awful might happen 0 0 1 1  Total GAD 7 Score 0 0 4 15  Anxiety Difficulty Not difficult at all Not difficult at all Not difficult at all Somewhat difficult    The patient does not have a history of falls. I did not complete a risk assessment for falls. A plan of care for falls was not documented.   Past Medical History:  Past Medical  History:  Diagnosis Date   Anxiety 2014   Aortic valve insufficiency, congenital    Endometrial polyp    Murmur    Severe aortic valve stenosis CONGENITAL --  CARDIOLOGIST -- DR BRACKBILL-- LOV IN EPIC    Vaginal cyst     Surgical History:  Past Surgical History:  Procedure Laterality Date   CARDIAC VALVE REPLACEMENT  2014   Fixed congenital aortic valve issues   TRANSTHORACIC ECHOCARDIOGRAM  12-18-2007  DR BRACKBILL   NORMAL LVSF AND DF/ EF 55-60%/ SEVERE CALCIFIC AORTIC STENOSIS W/ MODERATE - SEVERE  AORTIC INSUFFICIENCY/ MILD MITRAL SCLEROSIS/ NORMAL PULMONARY ARTERY PRESSURE / NO SIGNIFICANT CHANGE SINCE LAST ECHO 06-14-2006    Medications:  Current Outpatient Medications on File Prior to Visit  Medication Sig   estradiol (VIVELLE-DOT) 0.05 MG/24HR patch Place 1 patch onto the skin 2 (two) times a week.   levonorgestrel (MIRENA, 52 MG,) 20 MCG/24HR IUD Mirena 20 mcg/24 hours (5 yrs) 52 mg intrauterine  device  Take 1 device every day by intrauterine route.   valACYclovir (VALTREX) 1000 MG tablet Take 1 tablet by mouth as needed (breakouts).    venlafaxine  XR (EFFEXOR -XR) 37.5 MG 24 hr capsule Take 1 capsule (37.5 mg total) by mouth daily with breakfast.   aspirin 81 MG tablet Take 81 mg by mouth daily. (Patient not taking: Reported on 09/14/2023)   ondansetron  (ZOFRAN -ODT) 4 MG disintegrating tablet Take 1 tablet (4 mg total) by mouth every 8 (eight) hours as needed for nausea or vomiting. (Patient not taking: Reported on 09/14/2023)   scopolamine  (TRANSDERM-SCOP) 1 MG/3DAYS Place 1 patch (1.5 mg total) onto the skin every 3 (three) days. (Patient not taking: Reported on 09/14/2023)   No current facility-administered medications on file prior to visit.    Allergies:  No Known Allergies  Social History:  Social History   Socioeconomic History   Marital status: Single    Spouse name: Not on file   Number of children: Not on file   Years of education: Not on file   Highest  education level: Bachelor's degree (e.g., BA, AB, BS)  Occupational History   Not on file  Tobacco Use   Smoking status: Former    Current packs/day: 0.00    Average packs/day: 0.5 packs/day for 15.0 years (7.5 ttl pk-yrs)    Types: Cigarettes, E-cigarettes    Start date: 08/16/1991    Quit date: 08/16/2011    Years since quitting: 12.0   Smokeless tobacco: Never   Tobacco comments:    I quit years ago.  Vaping Use   Vaping status: Never Used  Substance and Sexual Activity   Alcohol use: Yes    Alcohol/week: 2.0 standard drinks of alcohol    Types: 2 Glasses of wine per week    Comment: weekends   Drug use: No   Sexual activity: Not Currently    Birth control/protection: I.U.D.    Comment: placed 12/05/17 [facility]  Other Topics Concern   Not on file  Social History Narrative   Not on file   Social Drivers of Health   Financial Resource Strain: Low Risk  (09/12/2023)   Overall Financial Resource Strain (CARDIA)    Difficulty of Paying Living Expenses: Not hard at all  Food Insecurity: No Food Insecurity (09/12/2023)   Hunger Vital Sign    Worried About Running Out of Food in the Last Year: Never true    Ran Out of Food in the Last Year: Never true  Transportation Needs: No Transportation Needs (09/12/2023)   PRAPARE - Administrator, Civil Service (Medical): No    Lack of Transportation (Non-Medical): No  Physical Activity: Sufficiently Active (09/12/2023)   Exercise Vital Sign    Days of Exercise per Week: 4 days    Minutes of Exercise per Session: 50 min  Stress: No Stress Concern Present (09/12/2023)   [employer] of Occupational Health - Occupational Stress Questionnaire    Feeling of Stress: Only a little  Social Connections: Moderately Integrated (09/12/2023)   Social Connection and Isolation Panel    Frequency of Communication with Friends and Family: Three times a week    Frequency of Social Gatherings with Friends and Family: Twice a week     Attends Religious Services: More than 4 times per year    Active Member of [employer] or Organizations: Yes    Attends Banker Meetings: Patient declined    Marital Status: Never married  Intimate Partner Violence: Not  on file   Social History   Tobacco Use  Smoking Status Former   Current packs/day: 0.00   Average packs/day: 0.5 packs/day for 15.0 years (7.5 ttl pk-yrs)   Types: Cigarettes, E-cigarettes   Start date: 08/16/1991   Quit date: 08/16/2011   Years since quitting: 12.0  Smokeless Tobacco Never  Tobacco Comments   I quit years ago.   Social History   Substance and Sexual Activity  Alcohol Use Yes   Alcohol/week: 2.0 standard drinks of alcohol   Types: 2 Glasses of wine per week   Comment: weekends    Family History:  Family History  Problem Relation Age of Onset   Heart disease Mother    Stroke Mother    Diabetes Mother    Heart disease Father    Stroke Father    Hypertension Father    Diabetes Father    Cancer Maternal Aunt        breast cancer    Past medical history, surgical history, medications, allergies, family history and social history reviewed with patient today and changes made to appropriate areas of the chart.   Review of Systems  Constitutional: Negative.   HENT: Negative.    Eyes: Negative.   Respiratory: Negative.    Cardiovascular: Negative.   Gastrointestinal: Negative.   Genitourinary: Negative.   Musculoskeletal:  Positive for joint pain (bilateral hips at times).  Skin: Negative.   Neurological: Negative.   Psychiatric/Behavioral: Negative.     All other ROS negative except what is listed above and in the HPI.      Objective:    BP 118/80 (BP Location: Left Arm, Patient Position: Sitting, Cuff Size: Normal)   Pulse (!) 59   Temp (!) 97.2 F (36.2 C)   Ht 5' 5 (1.651 m)   Wt 184 lb 3.2 oz (83.6 kg)   SpO2 99%   BMI 30.65 kg/m   Wt Readings from Last 3 Encounters:  09/14/23 184 lb 3.2 oz (83.6 kg)  07/08/23  180 lb (81.6 kg)  08/04/22 177 lb 12.8 oz (80.6 kg)    Physical Exam Vitals and nursing note reviewed.  Constitutional:      General: She is not in acute distress.    Appearance: Normal appearance.  HENT:     Head: Normocephalic and atraumatic.     Right Ear: Tympanic membrane, ear canal and external ear normal.     Left Ear: Tympanic membrane, ear canal and external ear normal.     Mouth/Throat:     Mouth: Mucous membranes are moist.     Pharynx: No posterior oropharyngeal erythema.  Eyes:     Conjunctiva/sclera: Conjunctivae normal.  Cardiovascular:     Rate and Rhythm: Normal rate and regular rhythm.     Pulses: Normal pulses.     Heart sounds: Murmur heard.  Pulmonary:     Effort: Pulmonary effort is normal.     Breath sounds: Normal breath sounds.  Abdominal:     Palpations: Abdomen is soft.     Tenderness: There is no abdominal tenderness.  Musculoskeletal:        General: Normal range of motion.     Cervical back: Normal range of motion and neck supple.     Right lower leg: No edema.     Left lower leg: No edema.  Lymphadenopathy:     Cervical: No cervical adenopathy.  Skin:    General: Skin is warm and dry.  Neurological:     General: No  focal deficit present.     Mental Status: She is alert and oriented to Eaton, place, and time.     Cranial Nerves: No cranial nerve deficit.     Coordination: Coordination normal.     Gait: Gait normal.  Psychiatric:        Mood and Affect: Mood normal.        Behavior: Behavior normal.        Thought Content: Thought content normal.        Judgment: Judgment normal.     Results for orders placed or performed in visit on 03/02/23  HM PAP SMEAR   Collection Time: 10/18/22 12:00 AM  Result Value Ref Range   HM Pap smear normal   Results Console HPV   Collection Time: 10/18/22 12:00 AM  Result Value Ref Range   CHL HPV Negative       Assessment & Plan:   Problem List Items Addressed This Visit        Cardiovascular and Mediastinum   Hot flash, menopausal   Chronic, stable. She has an IUD and estradiol patches. Continue collaboration and recommendations from GYN.         Other   S/P aortic valve replacement with bioprosthetic valve   Chronic, stable.  She had her valve replaced in 2014 and has been doing well since then.  She follows with cardiology regularly.  Continue antibiotics prophylactically before dental appointments. Check lipid panel today.       Relevant Orders   Lipid panel   Anxiety   Chronic, stable. Continue effexor  37.5mg  daily. Follow-up in 1 year or sooner with concerns.       Routine general medical examination at a health care facility - Primary   Health maintenance reviewed and updated. Discussed nutrition, exercise. Check CMP, CBC today. Follow-up 1 year.        Relevant Orders   CBC with Differential/Platelet   Comprehensive metabolic panel with GFR   Prediabetes   Chronic, stable. Check A1c today and treat based on results.       Relevant Orders   Comprehensive metabolic panel with GFR   Hemoglobin A1c   Other Visit Diagnoses       Multiple nevi       Multiple nevi, some new and dark. Referral placed to dermatology.   Relevant Orders   Ambulatory referral to Dermatology     Immunization due       prevnar 20 given today   Relevant Orders   Pneumococcal conjugate vaccine 20-valent        Follow up plan: Return in about 1 year (around 09/13/2024) for CPE.   LABORATORY TESTING:  - Pap smear: up to date  IMMUNIZATIONS:   - Tdap: Tetanus vaccination status reviewed: last tetanus booster within 10 years. - Influenza: Postponed to flu season - Pneumovax: Not applicable - Prevnar: Administered today - HPV: Not applicable - Shingrix  vaccine: Up to date  SCREENING: -Mammogram: Done elsewhere  - Colonoscopy: Up to date  - Bone Density: Not applicable   PATIENT COUNSELING:   Advised to take 1 mg of folate supplement per day if capable  of pregnancy.   Sexuality: Discussed sexually transmitted diseases, partner selection, use of condoms, avoidance of unintended pregnancy  and contraceptive alternatives.   Advised to avoid cigarette smoking.  I discussed with the patient that most people either abstain from alcohol or drink within safe limits (<=14/week and <=4 drinks/occasion for males, <=7/weeks and <= 3 drinks/occasion for  females) and that the risk for alcohol disorders and other health effects rises proportionally with the number of drinks per week and how often a drinker exceeds daily limits.  Discussed cessation/primary prevention of drug use and availability of treatment for abuse.   Diet: Encouraged to adjust caloric intake to maintain  or achieve ideal body weight, to reduce intake of dietary saturated fat and total fat, to limit sodium intake by avoiding high sodium foods and not adding table salt, and to maintain adequate dietary potassium and calcium preferably from fresh fruits, vegetables, and low-fat dairy products.    stressed the importance of regular exercise  Injury prevention: Discussed safety belts, safety helmets, smoke detector, smoking near bedding or upholstery.   Dental health: Discussed importance of regular tooth brushing, flossing, and dental visits.    NEXT PREVENTATIVE PHYSICAL DUE IN 1 YEAR. Return in about 1 year (around 09/13/2024) for CPE.  Shelby Eaton

## 2023-09-16 NOTE — Progress Notes (Signed)
 CARDIOLOGY OFFICE NOTE  Date:  09/27/2023    Shelby Eaton Date of Birth: 18-May-1970 Medical Record #[id number]  PCP:  Nedra Tinnie DELENA, NP  Cardiologist:  Delford   History of Present Illness: Shelby Eaton is a 53 y.o. female who presents today for a follow up visit.    She has a history of bicuspid aortic valve with severe aortic stenosis. On 02/28/12 she underwent aortic valve replacement at [facility] by Dr.Glower. She did well. She has a bovine bioprosthetic valve. Echo done 10/2019  showed mean gradient decreased from to 20 mmHg no PVL and DVI 0.32.  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  She seems anxious Horticulturist, commercial and always revising   She has  two Chief of Staff for company at home Has lost some weight and previous panic attacks now gone with new employment She has a Ship broker at home and needs to use more   TTE 11/13/19 mean gradient 20 peak 36.7 DVI 0.32  TTE 07/17/22  mean gradient 18 peak 33 mmHg   No symptoms Teeth in good shape Been walking more and weight is down has a big presentation latter today  Past Medical History:  Diagnosis Date   Anxiety 2014   Aortic valve insufficiency, congenital    Endometrial polyp    Murmur    Severe aortic valve stenosis CONGENITAL --  CARDIOLOGIST -- DR BRACKBILL-- LOV IN EPIC    Vaginal cyst     Past Surgical History:  Procedure Laterality Date   CARDIAC VALVE REPLACEMENT  2014   Fixed congenital aortic valve issues   TRANSTHORACIC ECHOCARDIOGRAM  12-18-2007  DR BRACKBILL   NORMAL LVSF AND DF/ EF 55-60%/ SEVERE CALCIFIC AORTIC STENOSIS W/ MODERATE - SEVERE  AORTIC INSUFFICIENCY/ MILD MITRAL SCLEROSIS/ NORMAL PULMONARY ARTERY PRESSURE / NO SIGNIFICANT CHANGE SINCE LAST ECHO 06-14-2006      Medications: Current Meds  Medication Sig   estradiol (VIVELLE-DOT) 0.05 MG/24HR patch Place 1 patch onto the skin 2 (two) times a week.   levonorgestrel (MIRENA, 52 MG,) 20 MCG/24HR IUD Mirena 20 mcg/24 hours (5 yrs) 52 mg intrauterine device  Take 1 device every day by intrauterine route.   venlafaxine  XR (EFFEXOR -XR) 37.5 MG 24 hr capsule Take 1 capsule (37.5 mg total) by mouth daily with breakfast.     Allergies: No Known Allergies  Social History: The patient  reports that she quit smoking about 12 years ago. Her smoking use included cigarettes and e-cigarettes. She started smoking about 32 years ago. She has a 7.5 pack-year smoking history. She has never used smokeless tobacco. She reports current alcohol use of about 2.0 standard drinks of alcohol per week. She reports that she does not use drugs.   Family History: The patient's family history includes Cancer in her maternal aunt; Diabetes in her father and mother; Heart disease in her father and mother; Hypertension in her father; Stroke in her father and mother.   Review of Systems: Please see the history of present illness.   All other systems are reviewed and negative.   Physical Exam: VS:  BP (!) 130/90 (BP Location: Right Arm, Patient Position: Sitting, Cuff Size: Normal)   Pulse (!) 56   Ht 5' 6 (1.676 m)   Wt  186 lb (84.4 kg)   SpO2 98%   BMI 30.02 kg/m  .  BMI Body mass index is 30.02 kg/m.  Wt Readings from Last 3 Encounters:  09/27/23 186 lb (84.4 kg)  09/14/23 184 lb 3.2 oz (83.6 kg)  07/08/23 180 lb (81.6 kg)    Affect appropriate Healthy:  appears stated age HEENT: normal Neck supple with no adenopathy JVP normal no bruits no thyromegaly Lungs clear with no wheezing and good diaphragmatic motion Heart:  S1/S2 SEM thorught AVR no AR murmur, no rub, gallop or click PMI normal post sternotomy  Abdomen: benighn, BS positve, no tenderness, no AAA no bruit.  No HSM or HJR Distal pulses intact  with no bruits No edema Neuro non-focal Skin warm and dry No muscular weakness    LABORATORY DATA:  EKG:  SR limb lead reversal IVCE poor R wave progression   Lab Results  Component Value Date   WBC 7.8 09/14/2023   HGB 13.2 09/14/2023   HCT 39.7 09/14/2023   PLT 259.0 09/14/2023   GLUCOSE 117 (H) 09/14/2023   CHOL 193 09/14/2023   TRIG 159.0 (H) 09/14/2023   HDL 53.60 09/14/2023   LDLCALC 108 (H) 09/14/2023   ALT 24 09/14/2023   AST 18 09/14/2023   NA 138 09/14/2023   K 4.2 09/14/2023   CL 104 09/14/2023   CREATININE 0.86 09/14/2023   BUN 15 09/14/2023   CO2 24 09/14/2023   TSH 2.09 08/04/2022   HGBA1C 6.2 09/14/2023     BNP (last 3 results) No results for input(s): BNP in the last 8760 hours.  ProBNP (last 3 results) No results for input(s): PROBNP in the last 8760 hours.   Other Studies Reviewed Today:  CORONARY CT IMPRESSION 10/2019: 1. Calcium score 0   2.  Normal right dominant coronary arteries   3. Normal appearing 21 mm CE pericardial tissue valve with no HALT/HAM   4. Upper limites normal ascending thoracic aorta 3.8 cm with normal arch vessels and no dissection/hematoma.   Shelby Eaton      ECHO IMPRESSIONS  07/17/22  AORTIC VALVE  AV Vmax:           284.40 cm/s  AV Vmean:          198.800 cm/s  AV VTI:            0.640 m  AV Peak Grad:      32.4 mmHg  AV Mean Grad:      20.0 mmHg  LVOT Vmax:         85.90 cm/s  LVOT Vmean:        59.600 cm/s  LVOT VTI:          0.227 m  LVOT/AV VTI ratio: 0.35     ASSESSMENT AND PLAN:   1. Prior AVR - mean gradient stable 20 mmHg with DVI 0.25 CTA  11/13/19 showed no HALT/HAM on 21 mm CE stented pericaridal tissue valve  implant 02/28/12 Some calcific degeneration but stable Update TTE  2. Chest pain - normal coronary C 11/13/19 T - favor GI component  Aortic root only 3.8 cm  Very atypical non exertional ? Related to anxiety no need for stress testing   3. HTN - does not like beta blockers   .    4. Obesity - very motivated to make changes.   5. Anxiety: started on Effexor    Current medicines are reviewed with the patient today.  The patient does not have concerns  regarding medicines other than what has been noted above.  The following changes have been made:  See above.  Labs/ tests ordered today include:    Orders Placed This Encounter  Procedures   EKG 12-Lead   ECHOCARDIOGRAM COMPLETE   Update TTE 53 yo bioprosthetic AVR  Disposition:   F/U in a year    Patient is agreeable to this plan and will call if any problems develop in the interim.   Signed: Maude Emmer, MD  09/27/2023 9:52 AM  [facility] [address] [place], [place]  [address] Phone: [phone] Fax: [phone]

## 2023-09-25 ENCOUNTER — Encounter (HOSPITAL_BASED_OUTPATIENT_CLINIC_OR_DEPARTMENT_OTHER): Payer: Self-pay

## 2023-09-27 ENCOUNTER — Ambulatory Visit: Attending: Cardiovascular Disease | Admitting: Cardiovascular Disease

## 2023-09-27 VITALS — BP 130/90 | HR 56 | Ht 66.0 in | Wt 186.0 lb

## 2023-09-27 DIAGNOSIS — Z952 Presence of prosthetic heart valve: Secondary | ICD-10-CM | POA: Diagnosis not present

## 2023-09-27 DIAGNOSIS — I350 Nonrheumatic aortic (valve) stenosis: Secondary | ICD-10-CM | POA: Diagnosis not present

## 2023-09-27 DIAGNOSIS — I454 Nonspecific intraventricular block: Secondary | ICD-10-CM

## 2023-09-27 NOTE — Patient Instructions (Signed)
 Medication Instructions:  Your physician recommends that you continue on your current medications as directed. Please refer to the Current Medication list given to you today.  *If you need a refill on your cardiac medications before your next appointment, please call your pharmacy*  Lab Work: If you have labs (blood work) drawn today and your tests are completely normal, you will receive your results only by: MyChart Message (if you have MyChart) OR A paper copy in the mail If you have any lab test that is abnormal or we need to change your treatment, we will call you to review the results.  Testing/Procedures: Your physician has requested that you have an echocardiogram. Echocardiography is a painless test that uses sound waves to create images of your heart. It provides your doctor with information about the size and shape of your heart and how well your heart's chambers and valves are working. This procedure takes approximately one hour. There are no restrictions for this procedure. Please do NOT wear cologne, perfume, aftershave, or lotions (deodorant is allowed). Please arrive 15 minutes prior to your appointment time.  Please note: We ask at that you not bring children with you during ultrasound (echo/ vascular) testing. Due to room size and safety concerns, children are not allowed in the ultrasound rooms during exams. Our front office staff cannot provide observation of children in our lobby area while testing is being conducted. An adult accompanying a patient to their appointment will only be allowed in the ultrasound room at the discretion of the ultrasound technician under special circumstances. We apologize for any inconvenience. Follow-Up: At [facility], you and your health needs are our priority.  As part of our continuing mission to provide you with exceptional heart care, our providers are all part of one team.  This team includes your primary Cardiologist (physician)  and Advanced Practice Providers or APPs (Physician Assistants and Nurse Practitioners) who all work together to provide you with the care you need, when you need it.  Your next appointment:   1 year(s)  Provider:   Janelle Mediate, MD    We recommend signing up for the patient portal called "MyChart".  Sign up information is provided on this After Visit Summary.  MyChart is used to connect with patients for Virtual Visits (Telemedicine).  Patients are able to view lab/test results, encounter notes, upcoming appointments, etc.  Non-urgent messages can be sent to your provider as well.   To learn more about what you can do with MyChart, go to [email or web address].

## 2023-10-25 ENCOUNTER — Encounter: Payer: Self-pay | Admitting: Nurse Practitioner

## 2023-10-25 MED ORDER — METFORMIN HCL ER 500 MG PO TB24: 500.0000 mg | ORAL_TABLET | Freq: Every day | ORAL | 2 refills | Status: DC

## 2023-11-01 ENCOUNTER — Ambulatory Visit: Payer: Self-pay | Admitting: Cardiovascular Disease

## 2023-11-01 ENCOUNTER — Ambulatory Visit (HOSPITAL_COMMUNITY)
Admission: RE | Admit: 2023-11-01 | Discharge: 2023-11-01 | Disposition: A | Discharge status: Home / Self Care | Source: Ambulatory Visit | Attending: Cardiology | Admitting: Cardiology

## 2023-11-01 DIAGNOSIS — I350 Nonrheumatic aortic (valve) stenosis: Secondary | ICD-10-CM | POA: Diagnosis present

## 2023-11-01 LAB — ECHOCARDIOGRAM COMPLETE
AR max vel: 0.72 cm2
AV Area VTI: 0.81 cm2
AV Area mean vel: 0.75 cm2
AV Mean grad: 20.5 mmHg
AV Peak grad: 38.3 mmHg
Ao pk vel: 3.09 m/s
Area-P 1/2: 3.78 cm2
MV VTI: 1.32 cm2
S' Lateral: 2.3 cm

## 2023-11-02 NOTE — Progress Notes (Signed)
 Attempted to call patient for echo results. No answer, left a vm of results.   Patient never viewed in mychart. Printed, highlighted doctor comments and mailed to patient

## 2023-12-03 ENCOUNTER — Other Ambulatory Visit: Payer: Self-pay | Admitting: Nurse Practitioner

## 2023-12-03 NOTE — Telephone Encounter (Signed)
 Requesting: VENLAFAXINE  HCL ER CAPS 37.5MG   Last Visit: 09/14/2023 Next Visit: Visit date not found Last Refill: 04/30/2023  Please Advise

## 2024-02-12 ENCOUNTER — Encounter: Payer: Self-pay | Admitting: Nurse Practitioner

## 2024-02-12 MED ORDER — METFORMIN HCL ER 500 MG PO TB24: 500.0000 mg | ORAL_TABLET | Freq: Every day | ORAL | 0 refills | Status: AC

## 2024-02-12 NOTE — Telephone Encounter (Signed)
 Appointment scheduled for 03/13/24 at 840am.

## 2024-03-13 ENCOUNTER — Ambulatory Visit: Admitting: Nurse Practitioner
